# Patient Record
Sex: Female | Born: 1955 | Race: White | Hispanic: No | Marital: Married | Smoking: Current some day smoker
Health system: Southern US, Community
[De-identification: ages and names within clinical notes are randomized; demographics above are authoritative.]

## PROBLEM LIST (undated history)

## (undated) DIAGNOSIS — I10 Essential (primary) hypertension: Secondary | ICD-10-CM

## (undated) HISTORY — PX: FOOT SURGERY: SHX648

## (undated) HISTORY — PX: ABDOMINAL HYSTERECTOMY: SHX81

## (undated) HISTORY — PX: CHOLECYSTECTOMY: SHX55

---

## 1999-01-18 ENCOUNTER — Encounter: Admission: RE | Admit: 1999-01-18 | Discharge: 1999-01-18 | Payer: Self-pay | Admitting: Obstetrics and Gynecology

## 1999-01-18 ENCOUNTER — Encounter: Payer: Self-pay | Admitting: Obstetrics and Gynecology

## 2000-12-28 ENCOUNTER — Other Ambulatory Visit: Admission: RE | Admit: 2000-12-28 | Discharge: 2000-12-28 | Payer: Self-pay | Admitting: Obstetrics and Gynecology

## 2001-12-11 ENCOUNTER — Other Ambulatory Visit: Admission: RE | Admit: 2001-12-11 | Discharge: 2001-12-11 | Payer: Self-pay | Admitting: Obstetrics and Gynecology

## 2003-08-07 ENCOUNTER — Ambulatory Visit (HOSPITAL_COMMUNITY): Admission: RE | Admit: 2003-08-07 | Discharge: 2003-08-07 | Payer: Self-pay | Admitting: Family Medicine

## 2004-03-24 ENCOUNTER — Ambulatory Visit: Payer: Self-pay | Admitting: Family Medicine

## 2004-10-14 ENCOUNTER — Ambulatory Visit: Payer: Self-pay | Admitting: Family Medicine

## 2005-02-14 ENCOUNTER — Ambulatory Visit: Payer: Self-pay | Admitting: Family Medicine

## 2005-06-14 ENCOUNTER — Encounter: Admission: RE | Admit: 2005-06-14 | Discharge: 2005-06-14 | Payer: Self-pay | Admitting: Obstetrics and Gynecology

## 2006-05-08 ENCOUNTER — Ambulatory Visit: Payer: Self-pay | Admitting: Family Medicine

## 2006-10-17 ENCOUNTER — Encounter: Admission: RE | Admit: 2006-10-17 | Discharge: 2006-10-17 | Payer: Self-pay | Admitting: Obstetrics and Gynecology

## 2008-01-08 ENCOUNTER — Encounter: Admission: RE | Admit: 2008-01-08 | Discharge: 2008-01-08 | Payer: Self-pay | Admitting: Obstetrics and Gynecology

## 2010-01-11 ENCOUNTER — Encounter
Admission: RE | Admit: 2010-01-11 | Discharge: 2010-01-11 | Payer: Self-pay | Source: Home / Self Care | Attending: Obstetrics and Gynecology | Admitting: Obstetrics and Gynecology

## 2010-01-24 ENCOUNTER — Encounter: Payer: Self-pay | Admitting: Family Medicine

## 2010-01-24 ENCOUNTER — Encounter: Payer: Self-pay | Admitting: Obstetrics and Gynecology

## 2010-01-24 ENCOUNTER — Encounter: Payer: Self-pay | Admitting: Obstetrics & Gynecology

## 2011-12-23 ENCOUNTER — Other Ambulatory Visit: Payer: Self-pay | Admitting: Obstetrics and Gynecology

## 2011-12-23 ENCOUNTER — Other Ambulatory Visit: Payer: Self-pay | Admitting: Family Medicine

## 2011-12-23 DIAGNOSIS — Z1231 Encounter for screening mammogram for malignant neoplasm of breast: Secondary | ICD-10-CM

## 2012-01-24 ENCOUNTER — Ambulatory Visit
Admission: RE | Admit: 2012-01-24 | Discharge: 2012-01-24 | Disposition: A | Discharge status: Home / Self Care | Payer: BC Managed Care – PPO | Source: Ambulatory Visit | Attending: Family Medicine | Admitting: Family Medicine

## 2012-01-24 DIAGNOSIS — Z1231 Encounter for screening mammogram for malignant neoplasm of breast: Secondary | ICD-10-CM

## 2012-06-16 ENCOUNTER — Emergency Department (HOSPITAL_COMMUNITY)
Admission: EM | Admit: 2012-06-16 | Discharge: 2012-06-16 | Disposition: A | Discharge status: Home / Self Care | Payer: BC Managed Care – PPO | Attending: Emergency Medicine | Admitting: Emergency Medicine

## 2012-06-16 ENCOUNTER — Encounter (HOSPITAL_COMMUNITY): Payer: Self-pay | Admitting: Emergency Medicine

## 2012-06-16 DIAGNOSIS — R630 Anorexia: Secondary | ICD-10-CM | POA: Insufficient documentation

## 2012-06-16 DIAGNOSIS — F17200 Nicotine dependence, unspecified, uncomplicated: Secondary | ICD-10-CM | POA: Insufficient documentation

## 2012-06-16 DIAGNOSIS — M2550 Pain in unspecified joint: Secondary | ICD-10-CM | POA: Insufficient documentation

## 2012-06-16 DIAGNOSIS — R5381 Other malaise: Secondary | ICD-10-CM | POA: Insufficient documentation

## 2012-06-16 DIAGNOSIS — I10 Essential (primary) hypertension: Secondary | ICD-10-CM | POA: Insufficient documentation

## 2012-06-16 DIAGNOSIS — Z79899 Other long term (current) drug therapy: Secondary | ICD-10-CM | POA: Insufficient documentation

## 2012-06-16 DIAGNOSIS — A779 Spotted fever, unspecified: Secondary | ICD-10-CM | POA: Insufficient documentation

## 2012-06-16 DIAGNOSIS — A770 Spotted fever due to Rickettsia rickettsii: Secondary | ICD-10-CM

## 2012-06-16 HISTORY — DX: Essential (primary) hypertension: I10

## 2012-06-16 LAB — POCT I-STAT, CHEM 8
Calcium, Ion: 1.17 mmol/L (ref 1.12–1.23)
Creatinine, Ser: 1.1 mg/dL (ref 0.50–1.10)
Glucose, Bld: 82 mg/dL (ref 70–99)
HCT: 41 % (ref 36.0–46.0)
Hemoglobin: 13.9 g/dL (ref 12.0–15.0)
Potassium: 4 mEq/L (ref 3.5–5.1)
TCO2: 31 mmol/L (ref 0–100)

## 2012-06-16 LAB — CBC WITH DIFFERENTIAL/PLATELET
Basophils Absolute: 0.1 10*3/uL (ref 0.0–0.1)
Basophils Relative: 1 % (ref 0–1)
Eosinophils Relative: 4 % (ref 0–5)
Lymphocytes Relative: 33 % (ref 12–46)
MCHC: 34.9 g/dL (ref 30.0–36.0)
MCV: 86.3 fL (ref 78.0–100.0)
Platelets: 219 10*3/uL (ref 150–400)
RDW: 12.3 % (ref 11.5–15.5)
WBC: 7.8 10*3/uL (ref 4.0–10.5)

## 2012-06-16 LAB — POCT I-STAT TROPONIN I

## 2012-06-16 LAB — URINALYSIS, ROUTINE W REFLEX MICROSCOPIC
Bilirubin Urine: NEGATIVE
Ketones, ur: NEGATIVE mg/dL
Leukocytes, UA: NEGATIVE
Nitrite: NEGATIVE
Protein, ur: NEGATIVE mg/dL
Urobilinogen, UA: 0.2 mg/dL (ref 0.0–1.0)
pH: 5.5 (ref 5.0–8.0)

## 2012-06-16 MED ORDER — OXYCODONE-ACETAMINOPHEN 5-325 MG PO TABS
2.0000 | ORAL_TABLET | Freq: Once | ORAL | Status: AC
  Administered 2012-06-16: 2 via ORAL
  Filled 2012-06-16: qty 2

## 2012-06-16 NOTE — ED Provider Notes (Signed)
History     CSN: [id number]  Arrival date & time 06/16/12  1308   First MD Initiated Contact with Patient 06/16/12 2007      Chief Complaint  Patient presents with  . Generalized Body Aches    joint aches    (Consider location/radiation/quality/duration/timing/severity/associated sxs/prior treatment) HPI This 57 year old female was bit by take a couple weeks ago in both of her left arm, then she felt fine for a week to week later she developed some generalized body aches fatigue general weakness and decreased appetite, she saw her doctor started her on doxycycline which is now and she tested positive for [facility] spotted fever, she was notified by her doctor's office about that lab results, she continues to have some body aches and this was sent to the ED today for evaluation, she is no fever no rash no confusion no headache no stiff neck, a week ago when her symptoms started she did have a headache but that is resolved, she is no confusion, she is no chest pain cough shortness breath abdominal pain vomiting or diarrhea, she just has generalized weakness generalized fatigue and generalized arthralgias and myalgias with no treatment prior to arrival other than doxycycline. Her symptoms are moderate severity. Past Medical History  Diagnosis Date  . Hypertension     Past Surgical History  Procedure Laterality Date  . Abdominal hysterectomy    . Foot surgery    . Cholecystectomy    . Cesarean section      No family history on file.  History  Substance Use Topics  . Smoking status: Current Some Day Smoker  . Smokeless tobacco: Not on file     Comment: uses E-cig at times  . Alcohol Use: Yes     Comment: social    OB History   Grav Para Term Preterm Abortions TAB SAB Ect Mult Living                  Review of Systems 10 Systems reviewed and are negative for acute change except as noted in the HPI. Allergies  Review of patient's allergies indicates no known  allergies.  Home Medications   Current Outpatient Rx  Name  Route  Sig  Dispense  Refill  . cholecalciferol (VITAMIN D) 1000 UNITS tablet   Oral   Take 1,000 Units by mouth daily.         Marland Kitchen doxycycline (VIBRA-TABS) 100 MG tablet   Oral   Take 100 mg by mouth 2 (two) times daily. Started 6.10.14 for 10 days         . estrogen-methylTESTOSTERone (ESTRATEST) 1.25-2.5 MG per tablet   Oral   Take 1 tablet by mouth daily.         Marland Kitchen levothyroxine (SYNTHROID, LEVOTHROID) 50 MCG tablet   Oral   Take 50 mcg by mouth daily before breakfast.         . promethazine (PHENERGAN) 25 MG tablet   Oral   Take 25 mg by mouth every 6 (six) hours as needed for nausea.           BP 137/88  Pulse 61  Temp(Src) 97.9 F (36.6 C) (Oral)  Resp 16  SpO2 98%  Physical Exam  Nursing note and vitals reviewed. Constitutional:  Awake, alert, nontoxic appearance.  HENT:  Head: Atraumatic.  Eyes: Right eye exhibits no discharge. Left eye exhibits no discharge.  Neck: Neck supple.  Cardiovascular: Normal rate and regular rhythm.   No murmur heard.  Pulmonary/Chest: Effort normal and breath sounds normal. No respiratory distress. She has no wheezes. She has no rales. She exhibits no tenderness.  Abdominal: Soft. Bowel sounds are normal. She exhibits no distension. There is no tenderness. There is no rebound and no guarding.  Musculoskeletal: She exhibits no edema and no tenderness.  Baseline ROM, no obvious new focal weakness.  Neurological: She is alert.  Mental status and motor strength appears baseline for patient and situation.  Skin: No rash noted.  Psychiatric: She has a normal mood and affect.    ED Course  Procedures (including critical care time)  Labs Reviewed  URINALYSIS, ROUTINE W REFLEX MICROSCOPIC - Abnormal; Notable for the following:    APPearance CLOUDY (*)    All other components within normal limits  CBC WITH DIFFERENTIAL  POCT I-STAT TROPONIN I  CG4 I-STAT  (LACTIC ACID)  POCT I-STAT, CHEM 8   No results found.   1. [facility] spotted fever       MDM  I doubt any other EMC precluding discharge at this time including, but not necessarily limited to the following:sepsis.        Hurman Horn, MD 06/17/12 [phone]

## 2012-06-16 NOTE — ED Notes (Addendum)
Pt c/o joint aches and not having any energy. Pt was seen at an urgent care earlier this week for headache, reports that she found a tick on her 2 weeks ago. Pt tested for [place] spotted fever and was told yesterday that she has it. Pt also c/o having epigastric pain onset yesterday.

## 2015-01-28 ENCOUNTER — Ambulatory Visit (INDEPENDENT_AMBULATORY_CARE_PROVIDER_SITE_OTHER): Payer: Managed Care, Other (non HMO) | Admitting: Podiatry

## 2015-01-28 ENCOUNTER — Ambulatory Visit: Payer: Self-pay

## 2015-01-28 DIAGNOSIS — M2040 Other hammer toe(s) (acquired), unspecified foot: Secondary | ICD-10-CM

## 2015-01-28 DIAGNOSIS — D169 Benign neoplasm of bone and articular cartilage, unspecified: Secondary | ICD-10-CM | POA: Diagnosis not present

## 2015-01-28 MED ORDER — DIAZEPAM 5 MG PO TABS: 5.0000 mg | ORAL_TABLET | Freq: Two times a day (BID) | ORAL | Status: DC | PRN

## 2015-01-28 NOTE — Patient Instructions (Signed)
Pre-Operative Instructions  Congratulations, you have decided to take an important step to improving your quality of life.  You can be assured that the doctors of [facility] will be with you every step of the way.  1. Plan to be at the surgery center/hospital at least 1 (one) hour prior to your scheduled time unless otherwise directed by the surgical center/hospital staff.  You must have a responsible adult accompany you, remain during the surgery and drive you home.  Make sure you have directions to the surgical center/hospital and know how to get there on time. 2. For hospital based surgery you will need to obtain a history and physical form from your family physician within 1 month prior to the date of surgery- we will give you a form for you primary physician.  3. We make every effort to accommodate the date you request for surgery.  There are however, times where surgery dates or times have to be moved.  We will contact you as soon as possible if a change in schedule is required.   4. No Aspirin/Ibuprofen for one week before surgery.  If you are on aspirin, any non-steroidal anti-inflammatory medications (Mobic, Aleve, Ibuprofen) you should stop taking it 7 days prior to your surgery.  You make take Tylenol  For pain prior to surgery.  5. Medications- If you are taking daily heart and blood pressure medications, seizure, reflux, allergy, asthma, anxiety, pain or diabetes medications, make sure the surgery center/hospital is aware before the day of surgery so they may notify you which medications to take or avoid the day of surgery. 6. No food or drink after midnight the night before surgery unless directed otherwise by surgical center/hospital staff. 7. No alcoholic beverages 24 hours prior to surgery.  No smoking 24 hours prior to or 24 hours after surgery. 8. Wear loose pants or shorts- loose enough to fit over bandages, boots, and casts. 9. No slip on shoes, sneakers are best. 10. Bring  your boot with you to the surgery center/hospital.  Also bring crutches or a walker if your physician has prescribed it for you.  If you do not have this equipment, it will be provided for you after surgery. 11. If you have not been contracted by the surgery center/hospital by the day before your surgery, call to confirm the date and time of your surgery. 12. Leave-time from work may vary depending on the type of surgery you have.  Appropriate arrangements should be made prior to surgery with your employer. 13. Prescriptions will be provided immediately following surgery by your doctor.  Have these filled as soon as possible after surgery and take the medication as directed. 14. Remove nail polish on the operative foot. 15. Wash the night before surgery.  The night before surgery wash the foot and leg well with the antibacterial soap provided and water paying special attention to beneath the toenails and in between the toes.  Rinse thoroughly with water and dry well with a towel.  Perform this wash unless told not to do so by your physician.  Enclosed: 1 Ice pack (please put in freezer the night before surgery)   1 Hibiclens skin cleaner   Pre-op Instructions  If you have any questions regarding the instructions, do not hesitate to call our office.  [place]: [address] [place], [place] [address] [phone]  [place]: [address] [place], [place] [address] [phone]  [place]: [address]  [place], [place] [address] [phone]  Dr. Richard   Tuchman DPM, Dr. Norman Regal DPM Dr. Richard Sikora DPM, Dr. M. Todd Hyatt DPM, Dr. Kathryn Egerton DPM 

## 2015-01-28 NOTE — Progress Notes (Signed)
Subjective:     Patient ID: Sandra Norman, female   DOB: [birth year], 60 y.o.   MRN: [id number]  HPI patient states I'm having terrible pain with my fifth toe and fourth toe left and it's been present for at least a year and I've tried to trim it and pad it and I did do another doctor and had an injection and padding and trimming without relief of my symptoms. I've tried different shoe gear and I've tried different   Review of Systems  All other systems reviewed and are negative.      Objective:   Physical Exam  Constitutional: She is oriented to person, place, and time.  Cardiovascular: Intact distal pulses.   Musculoskeletal: Normal range of motion.  Neurological: She is oriented to person, place, and time.  Skin: Skin is warm.  Nursing note and vitals reviewed.  neurovascular status found to be intact muscle strength adequate range of motion within normal limits with patient found to have a keratotic lesion fifth and fourth toe left foot with the fifth toe being on the distal medial aspect and the fourth toe on the lateral side with both of them being very painful and making it difficult to wear shoe gear. 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 At this point I have recommended due to long-term nature failure to respond to conservative care for distal medial exostectomy fifth digit left and arthroplasty fourth digit left. I explained the surgery to patient and allowed her and her caregiver to go over consent form reviewing exostectomy and hammertoe repair and all complications and the fact we will do this in the office and I reviewed with her again everything as outlined in the consent form. Patient is scheduled for outpatient surgery in is  encouraged to call with questions  X-ray Report indicate significant rotation of the fifth toe left with pressure of fifth toe against fourth toe with enlargement of the head of the proximal phalanx fourth toe left foot

## 2015-01-28 NOTE — Progress Notes (Signed)
   Subjective:    Patient ID: Sandra Norman, female    DOB: 12-08-1955, 60 y.o.   MRN: [id number]  HPI  Pt presents with painful interdigital corn 5th left met x several months, had treatment to scrap area with no relief  Review of Systems  All other systems reviewed and are negative.      Objective:   Physical Exam        Assessment & Plan:

## 2015-02-02 ENCOUNTER — Ambulatory Visit (INDEPENDENT_AMBULATORY_CARE_PROVIDER_SITE_OTHER): Payer: Managed Care, Other (non HMO) | Admitting: Podiatry

## 2015-02-02 ENCOUNTER — Encounter: Payer: Self-pay | Admitting: Podiatry

## 2015-02-02 DIAGNOSIS — M2042 Other hammer toe(s) (acquired), left foot: Secondary | ICD-10-CM

## 2015-02-02 DIAGNOSIS — M2040 Other hammer toe(s) (acquired), unspecified foot: Secondary | ICD-10-CM

## 2015-02-02 DIAGNOSIS — D169 Benign neoplasm of bone and articular cartilage, unspecified: Secondary | ICD-10-CM

## 2015-02-02 MED ORDER — HYDROCODONE-ACETAMINOPHEN 10-325 MG PO TABS: 1.0000 | ORAL_TABLET | Freq: Three times a day (TID) | ORAL | Status: DC | PRN

## 2015-02-02 NOTE — Progress Notes (Signed)
Subjective:     Patient ID: Sandra Norman, female   DOB: 1955-10-31, 60 y.o.   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 The patient's left foot was prepped and draped utilizing standard aseptic technique and the left foot was exsanguinated with Ace wrap and the tourniquet was inflated to 250 mmHg. The following procedures were performed. Attention was directed dorsal aspect fourth toe left where a semi-elliptical incision linear was made over the head of the proximal phalanx and the incision was deepened through subcutaneous tissue down to capsule and the skin wedge was removed in toto. A incision into the extensor tendon was made at the level of the proximal interphalangeal joint and the medial and lateral collateral ligaments were released and the head of the proximal phalanx was delivered from the wound and resected in toto. The wound was flushed with copious amounts of sterile Garamycin solution and the incision was sutured with 5-0 nylon. Procedure 2 attention was directed to the left fifth toe where a incision was made centered over the prominent exostosis on the medial side of the toe. The bone was exposed and utilizing a power sidecutting bur the offending exostosis was resected flush with the shaft of the fifth digit. All bone paste was removed from the area and the area was then flushed again and sutured with 5-0 nylon. The areas then had sterile dressing applied the tourniquet was released and capillary refill to the digits was found to be within normal limits. Patient was  taken to postop with a prescription for hydrocodone and left in satisfactory condition and will be seen back in approximately 1-2 weeks or earlier if needed

## 2015-02-20 ENCOUNTER — Ambulatory Visit (INDEPENDENT_AMBULATORY_CARE_PROVIDER_SITE_OTHER): Payer: Managed Care, Other (non HMO)

## 2015-02-20 ENCOUNTER — Encounter: Payer: Self-pay | Admitting: Podiatry

## 2015-02-20 ENCOUNTER — Ambulatory Visit (INDEPENDENT_AMBULATORY_CARE_PROVIDER_SITE_OTHER): Payer: Managed Care, Other (non HMO) | Admitting: Podiatry

## 2015-02-20 VITALS — Temp 98.1°F

## 2015-02-20 DIAGNOSIS — Z9889 Other specified postprocedural states: Secondary | ICD-10-CM

## 2015-02-20 DIAGNOSIS — M2040 Other hammer toe(s) (acquired), unspecified foot: Secondary | ICD-10-CM | POA: Diagnosis not present

## 2015-02-20 DIAGNOSIS — D169 Benign neoplasm of bone and articular cartilage, unspecified: Secondary | ICD-10-CM

## 2015-02-23 NOTE — Progress Notes (Signed)
Patient ID: Makynleigh Houx, female   DOB: 1955/06/04, 60 y.o.   MRN: [id number]  Subjective: Ledora Selva is a 60 y.o. is seen today in office s/p left 4th  Hammertoe and 5th digit exostectomy preformed on 02/02/15 with Dr. Paulla Dolly. She states that she is doing well and she remained the surgical shoe. Her pain is controlled. She is currently not taking pain medication.  Denies any systemic complaints such as fevers, chills, nausea, vomiting. No calf pain, chest pain, shortness of breath.   Objective: General: No acute distress, AAOx3  DP/PT pulses palpable 2/4, CRT < 3 sec to all digits.  Protective sensation intact. Motor function intact.  Left foot: Incision is well coapted without any evidence of dehiscence and sutures itnact. There is no surrounding erythema, ascending cellulitis, fluctuance, crepitus, malodor, drainage/purulence. There is minimal edema around the surgical site. There is no pain along the surgical site. Toes are in a rectus position No other areas of tenderness to bilateral lower extremities.  No other open lesions or pre-ulcerative lesions.  No pain with calf compression, swelling, warmth, erythema.   Assessment and Plan:  Status post left 4th and 5th toe correction, doing well with no complications   -Treatment options discussed including all alternatives, risks, and complications -X-rays were obtained and reviewed with the patient. No definitive evidence of acute fracture stress fracture. Status post repair fourth and fifth toes. -Sutures removed today. She consented to shower. Continue to the toes to help with swelling. She can start to transition back into regular shoe as tolerated. -Ice/elevation -Pain medication as needed. -Monitor for any clinical signs or symptoms of infection and DVT/PE and directed to call the office immediately should any occur or go to the ER. -Follow-up as scheduled  or sooner if any problems arise. In the meantime, encouraged to call the office  with any questions, concerns, change in symptoms.   Celesta Gentile, DPM

## 2015-03-13 ENCOUNTER — Ambulatory Visit (INDEPENDENT_AMBULATORY_CARE_PROVIDER_SITE_OTHER): Payer: Managed Care, Other (non HMO) | Admitting: Podiatry

## 2015-03-13 ENCOUNTER — Ambulatory Visit (INDEPENDENT_AMBULATORY_CARE_PROVIDER_SITE_OTHER): Payer: Managed Care, Other (non HMO)

## 2015-03-13 ENCOUNTER — Encounter: Payer: Self-pay | Admitting: Podiatry

## 2015-03-13 DIAGNOSIS — D169 Benign neoplasm of bone and articular cartilage, unspecified: Secondary | ICD-10-CM

## 2015-03-13 DIAGNOSIS — Z9889 Other specified postprocedural states: Secondary | ICD-10-CM

## 2015-03-13 DIAGNOSIS — M2040 Other hammer toe(s) (acquired), unspecified foot: Secondary | ICD-10-CM | POA: Diagnosis not present

## 2015-03-13 NOTE — Progress Notes (Signed)
Subjective:     Patient ID: Sandra Norman, female   DOB: [birth year], 60 y.o.   MRN: [id number]  HPI patient states she's doing real well with her left foot with minimal discomfort swelling and able to wear most types shoes except for closed in tight shoes   Review of Systems     Objective:   Physical Exam Neurovascular status intact muscle strength adequate range of motion within normal limits with patient's fourth and fifth digits left in good position with wound edges that are coapted well    Assessment:     Doing well post arthroplasty exostectomy    Plan:     Reviewed and advised on gradual return to normal shoe gear and reviewed final x-ray and discharge at this time  X-ray report indicates good alignment of the fourth and fifth digits was satisfactory resection of bone

## 2015-03-13 NOTE — Progress Notes (Signed)
Patient ID: Sandra Norman, female   DOB: Sep 29, 1955, 60 y.o.   MRN: [id number] Dr Paulla Dolly performed an exostectomy 5th lt and arthroplasty with removal of bone 4th, in office procedure on 02/02/15

## 2015-12-24 ENCOUNTER — Encounter (INDEPENDENT_AMBULATORY_CARE_PROVIDER_SITE_OTHER): Payer: Managed Care, Other (non HMO) | Admitting: Ophthalmology

## 2015-12-24 DIAGNOSIS — I10 Essential (primary) hypertension: Secondary | ICD-10-CM

## 2015-12-24 DIAGNOSIS — H35033 Hypertensive retinopathy, bilateral: Secondary | ICD-10-CM

## 2015-12-24 DIAGNOSIS — H2513 Age-related nuclear cataract, bilateral: Secondary | ICD-10-CM

## 2015-12-24 DIAGNOSIS — H353121 Nonexudative age-related macular degeneration, left eye, early dry stage: Secondary | ICD-10-CM

## 2015-12-24 DIAGNOSIS — H43813 Vitreous degeneration, bilateral: Secondary | ICD-10-CM

## 2015-12-29 ENCOUNTER — Other Ambulatory Visit: Payer: Self-pay | Admitting: Adult Health Nurse Practitioner

## 2015-12-29 DIAGNOSIS — M8580 Other specified disorders of bone density and structure, unspecified site: Secondary | ICD-10-CM

## 2015-12-29 DIAGNOSIS — E8940 Asymptomatic postprocedural ovarian failure: Secondary | ICD-10-CM

## 2015-12-29 DIAGNOSIS — Z1231 Encounter for screening mammogram for malignant neoplasm of breast: Secondary | ICD-10-CM

## 2016-01-18 ENCOUNTER — Other Ambulatory Visit: Payer: Self-pay

## 2016-01-18 ENCOUNTER — Ambulatory Visit: Payer: Self-pay

## 2016-02-17 ENCOUNTER — Other Ambulatory Visit: Payer: Self-pay

## 2016-02-17 ENCOUNTER — Ambulatory Visit: Payer: Self-pay

## 2019-03-28 ENCOUNTER — Other Ambulatory Visit: Payer: Self-pay | Admitting: Family Medicine

## 2019-03-28 DIAGNOSIS — Z1231 Encounter for screening mammogram for malignant neoplasm of breast: Secondary | ICD-10-CM

## 2019-04-05 ENCOUNTER — Other Ambulatory Visit: Payer: Self-pay

## 2019-04-05 ENCOUNTER — Ambulatory Visit
Admission: RE | Admit: 2019-04-05 | Discharge: 2019-04-05 | Disposition: A | Discharge status: Home / Self Care | Payer: BC Managed Care – PPO | Source: Ambulatory Visit | Attending: Family Medicine | Admitting: Family Medicine

## 2019-04-05 DIAGNOSIS — Z1231 Encounter for screening mammogram for malignant neoplasm of breast: Secondary | ICD-10-CM

## 2020-06-02 ENCOUNTER — Other Ambulatory Visit: Payer: Self-pay | Admitting: Family Medicine

## 2020-06-02 DIAGNOSIS — Z1231 Encounter for screening mammogram for malignant neoplasm of breast: Secondary | ICD-10-CM

## 2020-06-05 ENCOUNTER — Other Ambulatory Visit: Payer: Self-pay

## 2020-06-05 ENCOUNTER — Ambulatory Visit
Admission: RE | Admit: 2020-06-05 | Discharge: 2020-06-05 | Disposition: A | Discharge status: Home / Self Care | Payer: Medicare Other | Source: Ambulatory Visit | Attending: Family Medicine | Admitting: Family Medicine

## 2020-06-05 DIAGNOSIS — Z1231 Encounter for screening mammogram for malignant neoplasm of breast: Secondary | ICD-10-CM

## 2021-06-06 ENCOUNTER — Emergency Department (HOSPITAL_BASED_OUTPATIENT_CLINIC_OR_DEPARTMENT_OTHER): Payer: BC Managed Care – PPO

## 2021-06-06 ENCOUNTER — Encounter (HOSPITAL_BASED_OUTPATIENT_CLINIC_OR_DEPARTMENT_OTHER): Payer: Self-pay

## 2021-06-06 ENCOUNTER — Other Ambulatory Visit: Payer: Self-pay

## 2021-06-06 ENCOUNTER — Emergency Department (HOSPITAL_BASED_OUTPATIENT_CLINIC_OR_DEPARTMENT_OTHER)
Admission: EM | Admit: 2021-06-06 | Discharge: 2021-06-06 | Disposition: A | Discharge status: Home / Self Care | Payer: BC Managed Care – PPO | Attending: Emergency Medicine | Admitting: Emergency Medicine

## 2021-06-06 DIAGNOSIS — S0990XA Unspecified injury of head, initial encounter: Secondary | ICD-10-CM | POA: Diagnosis present

## 2021-06-06 DIAGNOSIS — S52501A Unspecified fracture of the lower end of right radius, initial encounter for closed fracture: Secondary | ICD-10-CM | POA: Insufficient documentation

## 2021-06-06 DIAGNOSIS — R42 Dizziness and giddiness: Secondary | ICD-10-CM | POA: Insufficient documentation

## 2021-06-06 DIAGNOSIS — W19XXXA Unspecified fall, initial encounter: Secondary | ICD-10-CM

## 2021-06-06 DIAGNOSIS — R519 Headache, unspecified: Secondary | ICD-10-CM | POA: Diagnosis not present

## 2021-06-06 DIAGNOSIS — Z7982 Long term (current) use of aspirin: Secondary | ICD-10-CM | POA: Insufficient documentation

## 2021-06-06 DIAGNOSIS — W1830XA Fall on same level, unspecified, initial encounter: Secondary | ICD-10-CM | POA: Insufficient documentation

## 2021-06-06 DIAGNOSIS — M25531 Pain in right wrist: Secondary | ICD-10-CM | POA: Diagnosis present

## 2021-06-06 DIAGNOSIS — S65301A Unspecified injury of deep palmar arch of right hand, initial encounter: Secondary | ICD-10-CM | POA: Diagnosis present

## 2021-06-06 NOTE — Discharge Instructions (Signed)
Call tomorrow to make an appointment this week with the radiologist as discussed.  Return to emergency room if you have any worsening symptoms.

## 2021-06-06 NOTE — ED Provider Notes (Signed)
[facility] Provider Note   CSN: [id number] Arrival date & time: 06/06/21  1002     History  Chief Complaint  Patient presents with   Fall   Hand Injury    Sandra Norman is a 66 y.o. female.  Patient is a 66 year old female who presents after a fall.  She states that last night she was putting on some tanning lotion and was bending over and fell backwards.  She lost her balance.  There was no dizziness or other symptoms preceding the fall.  She hit her head on the wall, making a hole in the wall.  There is no loss of conscious but she has been having a little bit of dizziness and a little bit of nausea today.  No neck or back pain.  She has pain in her tailbone and her right hand.  She is not on anticoagulants other than a baby aspirin.      Home Medications Prior to Admission medications   Medication Sig Start Date End Date Taking? Authorizing Provider  cholecalciferol (VITAMIN D) 1000 UNITS tablet Take 1,000 Units by mouth daily.    [provider]  diazepam (VALIUM) 5 MG tablet Take 1 tablet (5 mg total) by mouth every 12 (twelve) hours as needed for anxiety. 01/28/15   Wallene Huh, DPM  doxycycline (VIBRA-TABS) 100 MG tablet Take 100 mg by mouth 2 (two) times daily. Reported on 01/28/2015    [provider]  estrogen-methylTESTOSTERone (ESTRATEST) 1.25-2.5 MG per tablet Take 1 tablet by mouth daily.    [provider]  HYDROcodone-acetaminophen (NORCO) 10-325 MG tablet Take 1 tablet by mouth every 8 (eight) hours as needed. 02/02/15   Wallene Huh, DPM  levothyroxine (SYNTHROID, LEVOTHROID) 50 MCG tablet Take 50 mcg by mouth daily before breakfast.    [provider]  promethazine (PHENERGAN) 25 MG tablet Take 25 mg by mouth every 6 (six) hours as needed for nausea. Reported on 01/28/2015    [provider]      Allergies    Patient has no known allergies.    Review of Systems   Review of Systems   Constitutional:  Negative for activity change, appetite change and fever.  HENT:  Negative for dental problem, nosebleeds and trouble swallowing.   Eyes:  Negative for pain and visual disturbance.  Respiratory:  Negative for shortness of breath.   Cardiovascular:  Negative for chest pain.  Gastrointestinal:  Positive for nausea. Negative for abdominal pain and vomiting.  Genitourinary:  Negative for dysuria and hematuria.  Musculoskeletal:  Positive for arthralgias and back pain. Negative for joint swelling and neck pain.  Skin:  Negative for wound.  Neurological:  Positive for dizziness and headaches. Negative for weakness and numbness.  Psychiatric/Behavioral:  Negative for confusion.    Physical Exam Updated Vital Signs BP 127/66 (BP Location: Right Arm)   Pulse 70   Temp 98 F (36.7 C) (Oral)   Resp 14   SpO2 96%  Physical Exam Constitutional:      Appearance: She is well-developed.  HENT:     Head: Normocephalic and atraumatic.  Eyes:     Pupils: Pupils are equal, round, and reactive to light.  Neck:     Comments: No pain to the cervical thoracic or lumbosacral spine.  There are some mild tenderness over the coccyx Cardiovascular:     Rate and Rhythm: Normal rate and regular rhythm.     Heart sounds: Normal heart sounds.  Pulmonary:     Effort: Pulmonary effort is normal. No respiratory distress.     Breath sounds: Normal breath sounds. No wheezing or rales.  Chest:     Chest wall: No tenderness.  Abdominal:     General: Bowel sounds are normal.     Palpations: Abdomen is soft.     Tenderness: There is no abdominal tenderness. There is no guarding or rebound.  Musculoskeletal:        General: Normal range of motion.     Comments: There is some ecchymosis and tenderness over the dorsum of the right hand.  There are some tenderness over the second third and fourth metacarpals.  There is tenderness over the distal radius on the right.  There is no pain to the elbow or  shoulder.  No other pain on palpation or range of motion of the extremities.  No wounds on the right hand.  She has normal sensation and motor function distally.  Radial pulses are intact.  Lymphadenopathy:     Cervical: No cervical adenopathy.  Skin:    General: Skin is warm and dry.     Findings: No rash.  Neurological:     General: No focal deficit present.     Mental Status: She is alert and oriented to person, place, and time.    ED Results / Procedures / Treatments   Labs (all labs ordered are listed, but only abnormal results are displayed) Labs Reviewed - No data to display  EKG None  Radiology DG Sacrum/Coccyx  Result Date: 06/06/2021 CLINICAL DATA:  Fall last night.  Sacrococcygeal pain. EXAM: SACRUM AND COCCYX - 2+ VIEW COMPARISON:  None Available. FINDINGS: There is no evidence of fracture or other focal bone lesions. IMPRESSION: Negative. Electronically Signed   By: Marlaine Hind M.D.   On: 06/06/2021 11:14   DG Wrist Complete Right  Result Date: 06/06/2021 CLINICAL DATA:  Fall yesterday.  Right wrist pain and swelling. EXAM: RIGHT WRIST - COMPLETE 3+ VIEW COMPARISON:  None Available. FINDINGS: Nondisplaced fracture is seen through the distal radial metaphysis with intra-articular extension. No other fractures identified. No evidence of dislocation. 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 IMPRESSION: Nondisplaced fracture of the distal radius, with intra-articular extension. Electronically Signed   By: Marlaine Hind M.D.   On: 06/06/2021 11:13   CT Head Wo Contrast  Result Date: 06/06/2021 CLINICAL DATA:  Head trauma, minor (Age >= 65y); Neck trauma (Age >= 65y) EXAM: CT HEAD WITHOUT CONTRAST CT CERVICAL SPINE WITHOUT CONTRAST TECHNIQUE: Multidetector CT imaging of the head and cervical spine was performed following the standard protocol without intravenous contrast. Multiplanar CT image reconstructions of the cervical spine were also generated.  RADIATION DOSE REDUCTION: This exam was performed according to the departmental dose-optimization program which includes automated exposure control, adjustment of the mA and/or kV according to patient size and/or use of iterative reconstruction technique. COMPARISON:  CT 08/07/2003 FINDINGS: CT HEAD FINDINGS Brain: No evidence of acute infarction, hemorrhage, hydrocephalus, extra-axial collection or mass lesion/mass effect. Scattered low-density changes within the periventricular and subcortical white matter compatible with chronic microvascular ischemic change. Vascular: No hyperdense vessel or unexpected calcification. Skull: Normal. Negative for fracture or focal lesion. Sinuses/Orbits: No acute finding. Other: Negative for scalp hematoma. CT CERVICAL SPINE FINDINGS Alignment: Facet joints are aligned without dislocation or traumatic listhesis. Dens and lateral masses are aligned. Skull base and vertebrae: No acute fracture. No primary bone lesion or focal pathologic process. Soft tissues and  spinal canal: No prevertebral fluid or swelling. No visible canal hematoma. Disc levels: Relatively mild cervical spondylosis, most predominantly involving the facet joints. Upper chest: Negative. Other: None. IMPRESSION: 1. No acute intracranial abnormality. 2. No acute cervical spine fracture or subluxation. Electronically Signed   By: Davina Poke D.O.   On: 06/06/2021 11:29   CT Cervical Spine Wo Contrast  Result Date: 06/06/2021 CLINICAL DATA:  Head trauma, minor (Age >= 65y); Neck trauma (Age >= 65y) EXAM: CT HEAD WITHOUT CONTRAST CT CERVICAL SPINE WITHOUT CONTRAST TECHNIQUE: Multidetector CT imaging of the head and cervical spine was performed following the standard protocol without intravenous contrast. Multiplanar CT image reconstructions of the cervical spine were also generated. RADIATION DOSE REDUCTION: This exam was performed according to the departmental dose-optimization program which includes automated  exposure control, adjustment of the mA and/or kV according to patient size and/or use of iterative reconstruction technique. COMPARISON:  CT 08/07/2003 FINDINGS: CT HEAD FINDINGS Brain: No evidence of acute infarction, hemorrhage, hydrocephalus, extra-axial collection or mass lesion/mass effect. Scattered low-density changes within the periventricular and subcortical white matter compatible with chronic microvascular ischemic change. Vascular: No hyperdense vessel or unexpected calcification. Skull: Normal. Negative for fracture or focal lesion. Sinuses/Orbits: No acute finding. Other: Negative for scalp hematoma. CT CERVICAL SPINE FINDINGS Alignment: Facet joints are aligned without dislocation or traumatic listhesis. Dens and lateral masses are aligned. Skull base and vertebrae: No acute fracture. No primary bone lesion or focal pathologic process. Soft tissues and spinal canal: No prevertebral fluid or swelling. No visible canal hematoma. Disc levels: Relatively mild cervical spondylosis, most predominantly involving the facet joints. Upper chest: Negative. Other: None. IMPRESSION: 1. No acute intracranial abnormality. 2. No acute cervical spine fracture or subluxation. Electronically Signed   By: Davina Poke D.O.   On: 06/06/2021 11:29   DG Hand Complete Right  Result Date: 06/06/2021 CLINICAL DATA:  Fall yesterday.  Right hand injury and pain. EXAM: RIGHT HAND - COMPLETE 3+ VIEW COMPARISON:  None Available. FINDINGS: There is no evidence of fracture or dislocation. Osteoarthritis is seen involving distal interphalangeal joints, greatest in the index and middle fingers. Osteoarthritis is also seen involving the STT joint complex in the wrist. IMPRESSION: No acute findings. Osteoarthritis. Electronically Signed   By: Marlaine Hind M.D.   On: 06/06/2021 11:11    Procedures Procedures    Medications Ordered in ED Medications - No data to display  ED Course/ Medical Decision Making/ A&P                            Medical Decision Making Amount and/or Complexity of Data Reviewed Radiology: ordered and independent interpretation performed. Decision-making details documented in ED Course.  Risk OTC drugs.   Patient presents after mechanical fall.  She had CT scan of her head and cervical spine which showed no acute abnormalities.  X-rays of her right wrist show a nondisplaced distal radius fracture.  X-rays of her right hand show no acute fractures.  X-rays of her coccyx/sacrum show no acute fractures.  These were interpreted by me and confirmed by the radiologist.  She was placed in a thumb spica splint.  She was given information about hand follow-up.  She was advised to call on Monday for an appointment with the orthopedist this week.  She was advised in symptomatic care.  Return precautions were given.  Final Clinical Impression(s) / ED Diagnoses Final diagnoses:  Fall, initial encounter  Closed  fracture of distal end of right radius, unspecified fracture morphology, initial encounter    Rx / DC Orders ED Discharge Orders     None         Malvin Johns, MD 06/06/21 1218

## 2021-06-06 NOTE — ED Triage Notes (Signed)
Pt presents POV d/t a fall last night while putting on tanning lotion. Pt reports she was bending over and all of a sudden she "fell backwards".  Pt denies getting dizzy but unsure why she fell. Pt reports she hit the back of her head on the wall, put a whole in the wall, pain and swelling to her Right hand ans landed on her tail bone on the linoleum floor. Pt reports feeling a "little nauseous" this am. Takes a baby aspirin 81 mg daily

## 2021-06-06 NOTE — ED Notes (Signed)
Discharge paperwork given and understood. 

## 2021-07-07 ENCOUNTER — Other Ambulatory Visit: Payer: Self-pay | Admitting: Family Medicine

## 2021-07-07 DIAGNOSIS — Z1231 Encounter for screening mammogram for malignant neoplasm of breast: Secondary | ICD-10-CM

## 2021-07-13 ENCOUNTER — Ambulatory Visit
Admission: RE | Admit: 2021-07-13 | Discharge: 2021-07-13 | Disposition: A | Discharge status: Home / Self Care | Payer: Medicare Other | Source: Ambulatory Visit | Attending: Family Medicine | Admitting: Family Medicine

## 2021-07-13 DIAGNOSIS — Z1231 Encounter for screening mammogram for malignant neoplasm of breast: Secondary | ICD-10-CM

## 2021-12-03 ENCOUNTER — Other Ambulatory Visit: Payer: Self-pay

## 2021-12-03 ENCOUNTER — Emergency Department (HOSPITAL_BASED_OUTPATIENT_CLINIC_OR_DEPARTMENT_OTHER): Payer: BC Managed Care – PPO | Admitting: Radiology

## 2021-12-03 ENCOUNTER — Emergency Department (HOSPITAL_BASED_OUTPATIENT_CLINIC_OR_DEPARTMENT_OTHER): Payer: BC Managed Care – PPO

## 2021-12-03 ENCOUNTER — Emergency Department (HOSPITAL_BASED_OUTPATIENT_CLINIC_OR_DEPARTMENT_OTHER)
Admission: EM | Admit: 2021-12-03 | Discharge: 2021-12-03 | Disposition: A | Discharge status: Home / Self Care | Payer: BC Managed Care – PPO | Attending: Emergency Medicine | Admitting: Emergency Medicine

## 2021-12-03 ENCOUNTER — Encounter (HOSPITAL_BASED_OUTPATIENT_CLINIC_OR_DEPARTMENT_OTHER): Payer: Self-pay | Admitting: Emergency Medicine

## 2021-12-03 DIAGNOSIS — M79604 Pain in right leg: Secondary | ICD-10-CM | POA: Diagnosis not present

## 2021-12-03 DIAGNOSIS — M7989 Other specified soft tissue disorders: Secondary | ICD-10-CM | POA: Diagnosis not present

## 2021-12-03 DIAGNOSIS — R079 Chest pain, unspecified: Secondary | ICD-10-CM | POA: Diagnosis present

## 2021-12-03 DIAGNOSIS — M79651 Pain in right thigh: Secondary | ICD-10-CM

## 2021-12-03 LAB — BASIC METABOLIC PANEL
Anion gap: 9 (ref 5–15)
BUN: 14 mg/dL (ref 8–23)
CO2: 25 mmol/L (ref 22–32)
Calcium: 9.1 mg/dL (ref 8.9–10.3)
Chloride: 105 mmol/L (ref 98–111)
Creatinine, Ser: 0.93 mg/dL (ref 0.44–1.00)
GFR, Estimated: >60 mL/min (ref >60)
Glucose, Bld: 90 mg/dL (ref 70–99)
Potassium: 3.6 mmol/L (ref 3.5–5.1)
Sodium: 139 mmol/L (ref 135–145)

## 2021-12-03 LAB — D-DIMER, QUANTITATIVE: D-Dimer, Quant: 0.29 ug/mL-FEU (ref 0.00–0.50)

## 2021-12-03 LAB — CBC
HCT: 37.7 % (ref 36.0–46.0)
Hemoglobin: 12.3 g/dL (ref 12.0–15.0)
MCH: 27.6 pg (ref 26.0–34.0)
MCHC: 32.6 g/dL (ref 30.0–36.0)
MCV: 84.7 fL (ref 80.0–100.0)
Platelets: 278 10*3/uL (ref 150–400)
RBC: 4.45 MIL/uL (ref 3.87–5.11)
RDW: 14.5 % (ref 11.5–15.5)
WBC: 7.2 10*3/uL (ref 4.0–10.5)
nRBC: 0 % (ref 0.0–0.2)

## 2021-12-03 LAB — TROPONIN I (HIGH SENSITIVITY)
Troponin I (High Sensitivity): 6 ng/L (ref <18)
Troponin I (High Sensitivity): 6 ng/L (ref <18)

## 2021-12-03 NOTE — Discharge Instructions (Signed)
If you develop recurrent, continued, or worsening chest pain, shortness of breath, fever, vomiting, abdominal or back pain, or any other new/concerning symptoms then return to the ER for evaluation.  

## 2021-12-03 NOTE — ED Triage Notes (Signed)
Pt endorses CP under her breast for 2 weeks. Monday, she had a sharp pain radiate down her left arm. Today, she began having neck numbness.

## 2021-12-03 NOTE — ED Provider Notes (Signed)
[facility] DEPT Provider Note   CSN: [id number] Arrival date & time: 12/03/21  1235     History  Chief Complaint  Patient presents with   Chest Pain    Sandra Norman is a 66 y.o. female.  HPI 66 year old female presents with chest pain and right leg pain.  The chest pains been on and off for about 2 weeks.  Is a sharp pain under her breast.  Seems to come and go without any obvious cause.  It is sometimes pleuritic. The last time she felt it was on her way to chest x-ray today.  For about a week she is also had right posterior leg pain.  She went to urgent care yesterday and she was told to come to the ER last night for concern for DVT.  She has not noticed any color change.  No dyspnea.  Yesterday she noticed that there was pain radiating down her left arm.  Today she realized that her posterior neck feels weird to her and she describes as numb though she has intact sensation when she touches it.  Home Medications Prior to Admission medications   Medication Sig Start Date End Date Taking? Authorizing Provider  amLODipine-valsartan (EXFORGE) 10-160 MG tablet Take 1 tablet by mouth daily. 09/27/21   [provider]  cholecalciferol (VITAMIN D) 1000 UNITS tablet Take 1,000 Units by mouth daily.    [provider]  citalopram (CELEXA) 20 MG tablet Take 20 mg by mouth daily. 10/09/21   [provider]  diazepam (VALIUM) 5 MG tablet Take 1 tablet (5 mg total) by mouth every 12 (twelve) hours as needed for anxiety. 01/28/15   Wallene Huh, DPM  diclofenac Sodium (VOLTAREN) 1 % GEL Apply 1 Application topically 4 (four) times daily. 11/28/21   [provider]  doxycycline (VIBRA-TABS) 100 MG tablet Take 100 mg by mouth 2 (two) times daily. Reported on 01/28/2015    [provider]  estrogen-methylTESTOSTERone (ESTRATEST) 1.25-2.5 MG per tablet Take 1 tablet by mouth daily.    [provider]   HYDROcodone-acetaminophen (NORCO) 10-325 MG tablet Take 1 tablet by mouth every 8 (eight) hours as needed. 02/02/15   Wallene Huh, DPM  levothyroxine (SYNTHROID, LEVOTHROID) 50 MCG tablet Take 50 mcg by mouth daily before breakfast.    [provider]  promethazine (PHENERGAN) 25 MG tablet Take 25 mg by mouth every 6 (six) hours as needed for nausea. Reported on 01/28/2015    [provider]      Allergies    Patient has no known allergies.    Review of Systems   Review of Systems  Respiratory:  Negative for shortness of breath.   Cardiovascular:  Positive for chest pain and leg swelling.  Gastrointestinal:  Negative for abdominal pain.  Musculoskeletal:  Positive for myalgias and neck pain.  Neurological:  Negative for weakness and numbness.    Physical Exam Updated Vital Signs BP (!) 144/96   Pulse (!) 57   Temp 98.4 F (36.9 C) (Oral)   Resp (!) 21   Ht '5\' 3"'$  (1.6 m)   Wt 72.6 kg   SpO2 95%   BMI 28.34 kg/m  Physical Exam Vitals and nursing note reviewed.  Constitutional:      Appearance: She is well-developed.  HENT:     Head: Normocephalic and atraumatic.  Neck:     Comments: Normal sensation to her posterior neck. No tenderness Cardiovascular:     Rate and Rhythm: Normal  rate and regular rhythm.     Pulses:          Radial pulses are 2+ on the left side.       Posterior tibial pulses are 2+ on the right side.     Heart sounds: Normal heart sounds.  Pulmonary:     Effort: Pulmonary effort is normal.     Breath sounds: Normal breath sounds.  Abdominal:     Palpations: Abdomen is soft.     Tenderness: There is no abdominal tenderness.  Musculoskeletal:     Cervical back: No spinous process tenderness or muscular tenderness. Normal range of motion.     Right upper leg: Tenderness (mild) present. No swelling or edema.       Legs:  Skin:    General: Skin is warm and dry.  Neurological:     Mental Status: She is alert.     Comments: 5/5  strength in BUE. Grossly normal sensation     ED Results / Procedures / Treatments   Labs (all labs ordered are listed, but only abnormal results are displayed) Labs Reviewed  BASIC METABOLIC PANEL  CBC  D-DIMER, QUANTITATIVE  TROPONIN I (HIGH SENSITIVITY)  TROPONIN I (HIGH SENSITIVITY)    EKG EKG Interpretation  Date/Time:  Friday December 03 2021 13:46:01 EST Ventricular Rate:  56 PR Interval:  159 QRS Duration: 110 QT Interval:  489 QTC Calculation: 472 R Axis:   57 Text Interpretation: Sinus rhythm no acute ST/T changes similar to 2014 Confirmed by Sherwood Gambler [phone]) on 12/03/2021 2:04:23 PM  Radiology US Venous Img Lower Unilateral Right  Result Date: 12/03/2021 CLINICAL DATA:  Pain and swelling EXAM: Right LOWER EXTREMITY VENOUS DOPPLER ULTRASOUND TECHNIQUE: Gray-scale sonography with compression, as well as color and duplex ultrasound, were performed to evaluate the deep venous system(s) from the level of the common femoral vein through the popliteal and proximal calf veins. COMPARISON:  None Available. FINDINGS: VENOUS Normal compressibility of the common femoral, superficial femoral, and popliteal veins, as well as the visualized calf veins. Visualized portions of profunda femoral vein and great saphenous vein unremarkable. No filling defects to suggest DVT on grayscale or color Doppler imaging. Doppler waveforms show normal direction of venous flow, normal respiratory plasticity and response to augmentation. Limited views of the contralateral common femoral vein are unremarkable. OTHER None. Limitations: Body habitus IMPRESSION: There are no signs of acute DVT in right lower extremity. Electronically Signed   By: Elmer Picker M.D.   On: 12/03/2021 14:13   DG Chest 2 View  Result Date: 12/03/2021 CLINICAL DATA:  Chest pain and shortness of breath EXAM: CHEST - 2 VIEW COMPARISON:  None Available. FINDINGS: The heart size and mediastinal contours are within normal  limits. Mild left basilar atelectasis. Both lungs are otherwise clear. The visualized skeletal structures are unremarkable. IMPRESSION: No active cardiopulmonary disease. Electronically Signed   By: Yetta Glassman M.D.   On: 12/03/2021 13:05    Procedures Procedures    Medications Ordered in ED Medications - No data to display  ED Course/ Medical Decision Making/ A&P                           Medical Decision Making Amount and/or Complexity of Data Reviewed Labs: ordered.    Details: Troponin is normal.  D-dimer is normal.  No significant electrolyte disturbance. Radiology: ordered and independent interpretation performed.    Details: No focal pneumonia or CHF on x-ray.  DVT study without occlusion.   Patient presents with vague on and off chest pain.  Is been going on for weeks.  Troponins are negative.  ECG is not concerning for ischemia.  She also has this thigh pain without obvious trauma.  However there is no blood clot on ultrasound as she has a strong pulse in her foot.  No obvious cellulitis on exam.  She does not have a cough or other infectious symptoms.  She does have a little atelectasis on her chest x-ray but I do not think this represents infarction or pneumonia.  D-dimer is negative with otherwise low suspicion for PE.  At this point, will treat conservatively with NSAIDs, Tylenol, and follow-up with PCP.  Given return precautions.        Final Clinical Impression(s) / ED Diagnoses Final diagnoses:  Nonspecific chest pain  Right thigh pain    Rx / DC Orders ED Discharge Orders     None         Sherwood Gambler, MD 12/03/21 1523

## 2022-01-24 ENCOUNTER — Other Ambulatory Visit: Payer: Self-pay

## 2022-01-24 ENCOUNTER — Observation Stay (HOSPITAL_COMMUNITY)
Admission: EM | Admit: 2022-01-24 | Discharge: 2022-01-25 | Disposition: A | Discharge status: Home / Self Care | Payer: BC Managed Care – PPO | Attending: Cardiology | Admitting: Cardiology

## 2022-01-24 ENCOUNTER — Encounter (HOSPITAL_COMMUNITY): Payer: Self-pay

## 2022-01-24 ENCOUNTER — Emergency Department (HOSPITAL_COMMUNITY): Payer: BC Managed Care – PPO

## 2022-01-24 DIAGNOSIS — Z79899 Other long term (current) drug therapy: Secondary | ICD-10-CM | POA: Insufficient documentation

## 2022-01-24 DIAGNOSIS — E039 Hypothyroidism, unspecified: Secondary | ICD-10-CM | POA: Insufficient documentation

## 2022-01-24 DIAGNOSIS — F17200 Nicotine dependence, unspecified, uncomplicated: Secondary | ICD-10-CM | POA: Insufficient documentation

## 2022-01-24 DIAGNOSIS — I10 Essential (primary) hypertension: Secondary | ICD-10-CM

## 2022-01-24 DIAGNOSIS — R072 Precordial pain: Secondary | ICD-10-CM | POA: Diagnosis present

## 2022-01-24 DIAGNOSIS — R079 Chest pain, unspecified: Secondary | ICD-10-CM

## 2022-01-24 DIAGNOSIS — R0789 Other chest pain: Principal | ICD-10-CM | POA: Insufficient documentation

## 2022-01-24 DIAGNOSIS — I200 Unstable angina: Secondary | ICD-10-CM | POA: Diagnosis not present

## 2022-01-24 DIAGNOSIS — K219 Gastro-esophageal reflux disease without esophagitis: Secondary | ICD-10-CM

## 2022-01-24 DIAGNOSIS — Z7982 Long term (current) use of aspirin: Secondary | ICD-10-CM | POA: Insufficient documentation

## 2022-01-24 LAB — COMPREHENSIVE METABOLIC PANEL
ALT: 22 U/L (ref 0–44)
AST: 26 U/L (ref 15–41)
Albumin: 4.1 g/dL (ref 3.5–5.0)
Alkaline Phosphatase: 64 U/L (ref 38–126)
Anion gap: 10 (ref 5–15)
BUN: 15 mg/dL (ref 8–23)
CO2: 23 mmol/L (ref 22–32)
Calcium: 9.3 mg/dL (ref 8.9–10.3)
Chloride: 106 mmol/L (ref 98–111)
Creatinine, Ser: 1.06 mg/dL — ABNORMAL HIGH (ref 0.44–1.00)
GFR, Estimated: 58 mL/min — ABNORMAL LOW (ref >60)
Glucose, Bld: 103 mg/dL — ABNORMAL HIGH (ref 70–99)
Potassium: 3.7 mmol/L (ref 3.5–5.1)
Sodium: 139 mmol/L (ref 135–145)
Total Bilirubin: 0.4 mg/dL (ref 0.3–1.2)
Total Protein: 7.3 g/dL (ref 6.5–8.1)

## 2022-01-24 LAB — LIPID PANEL
Cholesterol: 178 mg/dL (ref 0–200)
HDL: 40 mg/dL — ABNORMAL LOW (ref >40)
LDL Cholesterol: 106 mg/dL — ABNORMAL HIGH (ref 0–99)
Total CHOL/HDL Ratio: 4.5 RATIO
Triglycerides: 161 mg/dL — ABNORMAL HIGH (ref <150)
VLDL: 32 mg/dL (ref 0–40)

## 2022-01-24 LAB — CBC
HCT: 41.2 % (ref 36.0–46.0)
Hemoglobin: 13.5 g/dL (ref 12.0–15.0)
MCH: 27.9 pg (ref 26.0–34.0)
MCHC: 32.8 g/dL (ref 30.0–36.0)
MCV: 85.1 fL (ref 80.0–100.0)
Platelets: 285 10*3/uL (ref 150–400)
RBC: 4.84 MIL/uL (ref 3.87–5.11)
RDW: 14 % (ref 11.5–15.5)
WBC: 11 10*3/uL — ABNORMAL HIGH (ref 4.0–10.5)
nRBC: 0 % (ref 0.0–0.2)

## 2022-01-24 LAB — HIV ANTIBODY (ROUTINE TESTING W REFLEX): HIV Screen 4th Generation wRfx: NONREACTIVE

## 2022-01-24 LAB — TROPONIN I (HIGH SENSITIVITY)
Troponin I (High Sensitivity): 6 ng/L (ref <18)
Troponin I (High Sensitivity): 5 ng/L (ref <18)
Troponin I (High Sensitivity): 6 ng/L (ref <18)

## 2022-01-24 MED ORDER — ASPIRIN 81 MG PO TBEC: 81.0000 mg | DELAYED_RELEASE_TABLET | Freq: Every day | ORAL | Status: DC

## 2022-01-24 MED ORDER — AMLODIPINE BESYLATE-VALSARTAN 10-160 MG PO TABS: 1.0000 | ORAL_TABLET | Freq: Every day | ORAL | Status: DC

## 2022-01-24 MED ORDER — ONDANSETRON HCL 4 MG/2ML IJ SOLN
4.0000 mg | Freq: Once | INTRAMUSCULAR | Status: DC
  Filled 2022-01-24: qty 2

## 2022-01-24 MED ORDER — HEPARIN (PORCINE) 25000 UT/250ML-% IV SOLN: 800.0000 [IU]/h | INTRAVENOUS | Status: DC

## 2022-01-24 MED ORDER — IRBESARTAN 300 MG PO TABS: 150.0000 mg | ORAL_TABLET | Freq: Every day | ORAL | Status: DC

## 2022-01-24 MED ORDER — ACETAMINOPHEN 325 MG PO TABS: 650.0000 mg | ORAL_TABLET | ORAL | Status: DC | PRN

## 2022-01-24 MED ORDER — AMLODIPINE BESYLATE 5 MG PO TABS: 10.0000 mg | ORAL_TABLET | Freq: Every day | ORAL | Status: DC

## 2022-01-24 MED ORDER — MORPHINE SULFATE (PF) 4 MG/ML IV SOLN
4.0000 mg | Freq: Once | INTRAVENOUS | Status: DC
  Filled 2022-01-24: qty 1

## 2022-01-24 MED ORDER — HEPARIN BOLUS VIA INFUSION
4000.0000 [IU] | Freq: Once | INTRAVENOUS | Status: DC
  Filled 2022-01-24: qty 4000

## 2022-01-24 MED ORDER — SODIUM CHLORIDE 0.9 % IV BOLUS (SEPSIS)
1000.0000 mL | Freq: Once | INTRAVENOUS | Status: AC
  Administered 2022-01-24: 1000 mL via INTRAVENOUS

## 2022-01-24 MED ORDER — LEVOTHYROXINE SODIUM 50 MCG PO TABS
50.0000 ug | ORAL_TABLET | Freq: Every day | ORAL | Status: DC
  Administered 2022-01-25: 50 ug via ORAL
  Filled 2022-01-24: qty 1

## 2022-01-24 MED ORDER — NITROGLYCERIN 0.4 MG SL SUBL
0.4000 mg | SUBLINGUAL_TABLET | SUBLINGUAL | Status: DC | PRN
  Administered 2022-01-24: 0.4 mg via SUBLINGUAL
  Filled 2022-01-24: qty 1

## 2022-01-24 MED ORDER — CITALOPRAM HYDROBROMIDE 10 MG PO TABS: 20.0000 mg | ORAL_TABLET | Freq: Every day | ORAL | Status: DC

## 2022-01-24 MED ORDER — ASPIRIN 81 MG PO CHEW
324.0000 mg | CHEWABLE_TABLET | Freq: Once | ORAL | Status: AC
  Administered 2022-01-24: 243 mg via ORAL
  Filled 2022-01-24: qty 4

## 2022-01-24 MED ORDER — ONDANSETRON HCL 4 MG/2ML IJ SOLN: 4.0000 mg | Freq: Four times a day (QID) | INTRAMUSCULAR | Status: DC | PRN

## 2022-01-24 NOTE — ED Notes (Signed)
Pt ambulated to restroom with EDT. NAD noted.

## 2022-01-24 NOTE — ED Provider Notes (Addendum)
[facility] Provider Note   CSN: [id number] Arrival date & time: 01/24/22  1027     History  Chief Complaint  Patient presents with   Chest Pain    Sandra Norman is a 67 y.o. female.  67 year old female with a history of HTN, tobacco use, and hypothyroidism who presents emergency department with palpitations, dizziness, diaphoresis, and chest discomfort.  Reports that she was at work earlier today and had a 15-minute episode of palpitations and dizziness.  This was accompanied by diaphoresis and she felt very warm went to the restroom and had an episode of nonbloody nonbilious emesis.  Also reports some shortness of breath with it.  Says that since then she has had persistent chest tightness that is substernal.  Unsure if exertional.  No recent illnesses or cough.  No history of MI.  Did have a heart catheterization over 10 years ago that was reportedly normal.  Is going to follow-up with [facility] cardiology tomorrow about an appointment.  Was counseled to decrease her caffeine intake with her palpitations and says that she reports 1 cup of coffee a day.  Denies any significant alcohol use.  Did have a similar episode to this 1 month ago.  No immediate family history of MI.       Home Medications Prior to Admission medications   Medication Sig Start Date End Date Taking? Authorizing Provider  albuterol (VENTOLIN HFA) 108 (90 Base) MCG/ACT inhaler Inhale 1-2 puffs into the lungs every 6 (six) hours as needed for wheezing or shortness of breath.   Yes [provider]  amLODipine-valsartan (EXFORGE) 10-160 MG tablet Take 1 tablet by mouth daily. 09/27/21  Yes [provider]  aspirin EC 81 MG tablet Take 81 mg by mouth daily.   Yes [provider]  CINNAMON PO Take 1 tablet by mouth daily.   Yes [provider]  citalopram (CELEXA) 20 MG tablet Take 20 mg by mouth daily. 10/09/21  Yes [provider]   levothyroxine (SYNTHROID, LEVOTHROID) 50 MCG tablet Take 50 mcg by mouth daily before breakfast.   Yes [provider]  Omega-3 Fatty Acids (FISH OIL PO) Take 1 capsule by mouth daily.   Yes [provider]      Allergies    Patient has no known allergies.    Review of Systems   Review of Systems  Physical Exam Updated Vital Signs BP (!) 142/86   Pulse 91   Temp 98 F (36.7 C) (Oral)   Resp 18   Ht '5\' 3"'$  (1.6 m)   Wt 72.6 kg   SpO2 96%   BMI 28.34 kg/m   Physical Exam Vitals and nursing note reviewed.  Constitutional:      General: She is not in acute distress.    Appearance: She is well-developed.  HENT:     Head: Normocephalic and atraumatic.     Right Ear: External ear normal.     Left Ear: External ear normal.     Nose: Nose normal.  Eyes:     Extraocular Movements: Extraocular movements intact.     Conjunctiva/sclera: Conjunctivae normal.     Pupils: Pupils are equal, round, and reactive to light.  Cardiovascular:     Rate and Rhythm: Normal rate and regular rhythm.     Heart sounds: No murmur heard. Pulmonary:     Effort: Pulmonary effort is normal. No respiratory distress.     Breath sounds: Normal breath sounds.  Abdominal:     General: Abdomen is flat. There is no distension.     Palpations: Abdomen is soft. There is no mass.     Tenderness: There is no abdominal tenderness. There is no guarding.  Musculoskeletal:     Cervical back: Normal range of motion and neck supple.     Right lower leg: No edema.     Left lower leg: No edema.  Skin:    General: Skin is warm and dry.  Neurological:     Mental Status: She is alert and oriented to person, place, and time. Mental status is at baseline.  Psychiatric:        Mood and Affect: Mood normal.     ED Results / Procedures / Treatments   Labs (all labs ordered are listed, but only abnormal results are displayed) Labs Reviewed  CBC - Abnormal; Notable for the following components:       Result Value   WBC 11.0 (*)    All other components within normal limits  COMPREHENSIVE METABOLIC PANEL - Abnormal; Notable for the following components:   Glucose, Bld 103 (*)    Creatinine, Ser 1.06 (*)    GFR, Estimated 58 (*)    All other components within normal limits  CBC  HIV ANTIBODY (ROUTINE TESTING W REFLEX)  LIPID PANEL  TROPONIN I (HIGH SENSITIVITY)  TROPONIN I (HIGH SENSITIVITY)  TROPONIN I (HIGH SENSITIVITY)    EKG EKG Interpretation  Date/Time:  Monday January 24 2022 11:19:52 EST Ventricular Rate:  52 PR Interval:  159 QRS Duration: 118 QT Interval:  518 QTC Calculation: 482 R Axis:   59 Text Interpretation: Sinus rhythm Probable left atrial enlargement Nonspecific intraventricular conduction delay Minimal ST elevation, inferior leads When compared to 12/03/21 no significant change Confirmed by Margaretmary Eddy [phone]) on 01/24/2022 11:38:29 AM  Radiology DG Chest 2 View  Result Date: 01/24/2022 CLINICAL DATA:  Shortness of breath.  Chest tightness. EXAM: CHEST - 2 VIEW COMPARISON:  12/03/2021 FINDINGS: Artifact overlies the chest. Heart size is normal. Mediastinal shadows are normal. The lungs are clear. No effusions. No significant bone finding. IMPRESSION: No active cardiopulmonary disease. Electronically Signed   By: Grabill Chimes M.D.   On: 01/24/2022 11:51    Procedures Procedures   Medications Ordered in ED Medications  nitroGLYCERIN (NITROSTAT) SL tablet 0.4 mg (0.4 mg Sublingual Given 01/24/22 1516)  morphine (PF) 4 MG/ML injection 4 mg (0 mg Intravenous Hold 01/24/22 1655)  ondansetron (ZOFRAN) injection 4 mg (0 mg Intravenous Hold 01/24/22 1655)  acetaminophen (TYLENOL) tablet 650 mg (has no administration in time range)  ondansetron (ZOFRAN) injection 4 mg (has no administration in time range)  aspirin EC tablet 81 mg (has no administration in time range)  citalopram (CELEXA) tablet 20 mg (has no administration in time range)  levothyroxine  (SYNTHROID) tablet 50 mcg (has no administration in time range)  irbesartan (AVAPRO) tablet 150 mg (has no administration in time range)  amLODipine (NORVASC) tablet 10 mg (has no administration in time range)  aspirin chewable tablet 324 mg (243 mg Oral Given 01/24/22 1321)  sodium chloride 0.9 % bolus 1,000 mL (0 mLs Intravenous Stopped 01/24/22 1612)    ED Course/ Medical Decision Making/ A&P Clinical Course as of 01/24/22 1949  Mon Jan 24, 2022  1223 HEART Score of 7 points.  [RP]  1309 Dr Einar Gip has reviewed case. Recommends nitro and GI cocktail if that does not work will consider admission.  [RP]  1528 Pt  became hypotensive with nitroglycerin.  [RP]  1658 Dr Einar Gip to admit [RP]    Clinical Course User Index [RP] Fransico Meadow, MD                             Medical Decision Making Amount and/or Complexity of Data Reviewed Labs: ordered.  Risk OTC drugs. Prescription drug management. Decision regarding hospitalization.   Yareni Creps is a 67 y.o. female with comorbidities that complicate the patient evaluation including HTN, tobacco use, and hypothyroidism who presents emergency department with palpitations, dizziness, diaphoresis, and chest discomfort.   Initial Ddx:  MI, PE, pneumonia, vasovagal syncope, paroxysmal arrhythmia  MDM:  Patient was concerned about possible arrhythmia that was causing the patient's symptoms since she was describing palpitations along with her other symptoms.  MI also under consideration with her chest discomfort and diaphoresis.  Did consider PE but her description of her pain is not consistent with this diagnosis so feel it is less likely.  If above workup is negative potentially could have had a vasovagal episode.  Plan:  Labs Troponin Chest x-ray EKG Aspirin Nitroglycerin  ED Summary/Re-evaluation:  Patient was given nitroglycerin and aspirin with improvement of her chest discomfort.  Did become hypotensive so given morphine  afterwards for pain.  Initial troponin was WNL and EKG without any new ischemic changes.  Discussed with cardiology who felt the patient should be admitted for further management.  They also requested that she be started on heparin drip for possible unstable angina.  This patient presents to the ED for concern of complaints listed in HPI, this involves an extensive number of treatment options, and is a complaint that carries with it a high risk of complications and morbidity. Disposition including potential need for admission considered.   Dispo: Admit to Floor  Additional history obtained from son Records reviewed Outpatient Clinic Notes The following labs were independently interpreted: Serial Troponins and show no acute abnormality I independently reviewed the following imaging with scope of interpretation limited to determining acute life threatening conditions related to emergency care: Chest x-ray and agree with the radiologist interpretation with the following exceptions: None I personally reviewed and interpreted cardiac monitoring: normal sinus rhythm  I personally reviewed and interpreted the pt's EKG: see above for interpretation  I have reviewed the patients home medications and made adjustments as needed Consults: Cardiology Social Determinants of health:  None  Final Clinical Impression(s) / ED Diagnoses Final diagnoses:  Chest pain, unspecified type  Unstable angina ([place])    Rx / DC Orders ED Discharge Orders     None      CRITICAL CARE Performed by: Fransico Meadow   Total critical care time: 30 minutes  Critical care time was exclusive of separately billable procedures and treating other patients.  Critical care was necessary to treat or prevent imminent or life-threatening deterioration.  Critical care was time spent personally by me on the following activities: development of treatment plan with patient and/or surrogate as well as nursing, discussions  with consultants, evaluation of patient's response to treatment, examination of patient, obtaining history from patient or surrogate, ordering and performing treatments and interventions, ordering and review of laboratory studies, ordering and review of radiographic studies, pulse oximetry and re-evaluation of patient's condition.    Fransico Meadow, MD 01/24/22 1949    Fransico Meadow, MD 01/24/22 [phone]

## 2022-01-24 NOTE — ED Triage Notes (Signed)
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  Reports now chest is tight and having tingling in bilateral hands. Was seen in dec for similar symptoms.

## 2022-01-24 NOTE — H&P (Signed)
CARDIOLOGY ADMIT NOTE   Patient ID: Sandra Norman MRN: [id number] DOB/AGE: 08/29/1955 67 y.o.  Admit date: 01/24/2022 Primary Physician:  [name], MD  Patient ID: Sandra Norman, female    DOB: 1955/06/25, 67 y.o.   MRN: [id number]  Chief Complaint  Patient presents with   Chest Pain   HPI:    Sandra Norman  is a 67 y.o. Caucasian female patient with hypertension, nondiabetic, no history of hypercholesterolemia, no family history of premature coronary disease, a month ago had felt chest pain in the middle of the chest while doing routine activity, lasting several days and was actually referred to our practice to be seen tomorrow.  While working, she suddenly felt sick and thought she was going to throw up and also had severe chest pain again in the middle of the chest.  Then she started feeling sick to her stomach, went to the bathroom and had a regular bowel movement and when she came back and emesis followed by near syncope.  In view of chest pain, EMS was activated and patient was eventually brought to the emergency room.  On further questioning, patient started having chest pain at 8 AM this morning and has been continuous.  Worse when she takes a deep breath.  She also has been having symptoms of GERD and heartburn, sometimes feels like the food gets stuck in the pharynx and wonders if cardiac workup is negative she should have GI workup.  While in the emergency room due to chest pain that was ongoing, received sublingual nitroglycerin and patient stated she had significant relief of chest pain.  She then had hypotension and need to be fluid resuscitated and hence I was called for admission and or possible evaluation.  Patient states that chest pain has returned and it is present and describes it as pressure in the middle of the chest.  No other symptoms.  On further questioning, main reason the EMS brought her to the hospital was near syncope and she started complaining of left  arm tingling and numbness.  The symptoms resolved completely within a few minutes.  So presently except for continued mild chest pain she has no other symptoms.  Past Medical History:  Diagnosis Date   Hypertension    Past Surgical History:  Procedure Laterality Date   ABDOMINAL HYSTERECTOMY     CESAREAN SECTION     CHOLECYSTECTOMY     FOOT SURGERY     Social History   Socioeconomic History   Marital status: Married    Spouse name: Not on file   Number of children: Not on file   Years of education: Not on file   Highest education level: Not on file  Occupational History   Not on file  Tobacco Use   Smoking status: Some Days   Smokeless tobacco: Not on file   Tobacco comments:    uses E-cig at times  Substance and Sexual Activity   Alcohol use: Yes    Comment: social   Drug use: No   Sexual activity: Not on file  Other Topics Concern   Not on file  Social History Narrative   Not on file   Social Determinants of Health   Financial Resource Strain: Not on file  Food Insecurity: Not on file  Transportation Needs: Not on file  Physical Activity: Not on file  Stress: Not on file  Social Connections: Not on file  Intimate Partner Violence: Not on file   History reviewed. No pertinent family  history.  ROS  Review of Systems  Cardiovascular:  Positive for chest pain. Negative for dyspnea on exertion and leg swelling.   Objective      01/24/2022    4:15 PM 01/24/2022    4:00 PM 01/24/2022    3:43 PM  Vitals with BMI  Systolic 102 585 277  Diastolic 76 87 87  Pulse 55 52 51      Physical Exam Neck:     Vascular: No carotid bruit or JVD.  Cardiovascular:     Rate and Rhythm: Normal rate and regular rhythm.     Pulses: Intact distal pulses.     Heart sounds: Normal heart sounds. No murmur heard.    No gallop.  Pulmonary:     Effort: Pulmonary effort is normal.     Breath sounds: Normal breath sounds.  Chest:     Chest wall: Tenderness (Precordium, right  and left parasternal regions) present.  Abdominal:     General: Bowel sounds are normal.     Palpations: Abdomen is soft.  Musculoskeletal:     Right lower leg: No edema.     Left lower leg: No edema.    Laboratory examination:   Recent Labs    12/03/21 1250 01/24/22 1050  NA 139 139  K 3.6 3.7  CL 105 106  CO2 25 23  GLUCOSE 90 103*  BUN 14 15  CREATININE 0.93 1.06*  CALCIUM 9.1 9.3  GFRNONAA >60 58*      Latest Ref Rng & Units 01/24/2022   10:50 AM 12/03/2021   12:50 PM 06/16/2012    8:39 PM  CMP  Glucose 70 - 99 mg/dL 103  90  82   BUN 8 - 23 mg/dL '15  14  10   '$ Creatinine 0.44 - 1.00 mg/dL 1.06  0.93  1.10   Sodium 135 - 145 mmol/L 139  139  145   Potassium 3.5 - 5.1 mmol/L 3.7  3.6  4.0   Chloride 98 - 111 mmol/L 106  105  108   CO2 22 - 32 mmol/L 23  25    Calcium 8.9 - 10.3 mg/dL 9.3  9.1    Total Protein 6.5 - 8.1 g/dL 7.3     Total Bilirubin 0.3 - 1.2 mg/dL 0.4     Alkaline Phos 38 - 126 U/L 64     AST 15 - 41 U/L 26     ALT 0 - 44 U/L 22         Latest Ref Rng & Units 01/24/2022   10:50 AM 12/03/2021   12:50 PM 06/16/2012    8:39 PM  CBC  WBC 4.0 - 10.5 K/uL 11.0  7.2    Hemoglobin 12.0 - 15.0 g/dL 13.5  12.3  13.9   Hematocrit 36.0 - 46.0 % 41.2  37.7  41.0   Platelets 150 - 400 K/uL 285  278     Lipid Panel  No results found for: "CHOL", "TRIG", "HDL", "CHOLHDL", "VLDL", "LDLCALC", "LDLDIRECT" HEMOGLOBIN A1C No results found for: "HGBA1C", "MPG" TSH No results for input(s): "TSH" in the last 8760 hours. BNP (last 3 results) No results for input(s): "BNP" in the last 8760 hours. Cardiac Panel (last 3 results) Recent Labs    01/24/22 1050 01/24/22 1236  TROPONINIHS 6 5     Medications and allergies  No Known Allergies   Current Outpatient Medications  Medication Instructions   albuterol (VENTOLIN HFA) 108 (90 Base) MCG/ACT inhaler 1-2 puffs, Inhalation,  Every 6 hours PRN   amLODipine-valsartan (EXFORGE) 10-160 MG tablet 1 tablet, Oral,  Daily   aspirin EC 81 mg, Oral, Daily   CINNAMON PO 1 tablet, Oral, Daily   citalopram (CELEXA) 20 mg, Oral, Daily   levothyroxine (SYNTHROID) 50 mcg, Oral, Daily before breakfast   Omega-3 Fatty Acids (FISH OIL PO) 1 capsule, Oral, Daily   Radiology:  DG Chest 2 View  Result Date: 01/24/2022 CLINICAL DATA:  Shortness of breath.  Chest tightness. EXAM: CHEST - 2 VIEW COMPARISON:  12/03/2021 FINDINGS: Artifact overlies the chest. Heart size is normal. Mediastinal shadows are normal. The lungs are clear. No effusions. No significant bone finding. IMPRESSION: No active cardiopulmonary disease. Electronically Signed   By: Kirk Chimes M.D.   On: 01/24/2022 11:51    Cardiac Studies:   EKG 01/24/2022: Normal sinus rhythm, repolarization.  No change from 12/03/2021.  Normal EKG.  Assessment   1.  Precordial pain, very low suspicion for unstable angina in view of ongoing chest pain since 8 AM and still cardiac markers are negative, EKG is normal.  She still has persistent chest pain but has reproducible quality to this. 2.  Primary hypertension  Recommendations:   Patient with no significant cardiovascular disease except for hypertension admitted to the hospital with chest pain that appears at most atypical for angina.  My suspicion for unstable angina is very low in view of normal EKG, reproducible chest pain and troponins been negative in spite of chest pain ongoing for several hours.  She also has mildly reproducible chest pain and also nitrate responsive chest pain could have been related to her underlying GERD.  She in fact request GI consultation.  I offered her discharge from the hospital and perform outpatient stress test for restratification.  Patient prefers to be in the hospital as she is worried about cardiac status, exercise nuclear stress test will be set up and if negative will be discharged home.  Lipids will be checked.  For now I will continue aspirin, but do not think she needs IV  heparin.  Her son Vicente Males is present and all questions answered.   Adrian Prows, MD, [facility] 01/24/2022, 5:52 PM Office: [phone] Fax: [phone] Pager: [phone]

## 2022-01-24 NOTE — ED Provider Triage Note (Signed)
Emergency Medicine Provider Triage Evaluation Note  Sandra Norman , a 67 y.o. female  was evaluated in triage.  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 Denies any headache, endorses nausea. Reports similar symptoms in December.  Review of Systems  Positive: Chest pain, SOB Negative: headache  Physical Exam  Ht '5\' 3"'$  (1.6 m)   Wt 72.6 kg   BMI 28.34 kg/m  Gen:   Awake, no distress   Resp:  Normal effort  MSK:   Moves extremities without difficulty  Other:  Equal pulses bilaterally  Medical Decision Making  Medically screening exam initiated at 10:35 AM.  Appropriate orders placed.  Loreli Debruler was informed that the remainder of the evaluation will be completed by another provider, this initial triage assessment does not replace that evaluation, and the importance of remaining in the ED until their evaluation is complete.     Osvaldo Shipper, [place] 01/24/22 1037

## 2022-01-24 NOTE — Progress Notes (Addendum)
ANTICOAGULATION CONSULT NOTE - Initial Consult  Pharmacy Consult for heparin Indication: chest pain/ACS  No Known Allergies  Patient Measurements: Height: '5\' 3"'$  (160 cm) Weight: 72.6 kg (160 lb) IBW/kg (Calculated) : 52.4 HEPARIN DW (KG): 67.6   Vital Signs: Temp: 97.9 F (36.6 C) (01/22 1517) Temp Source: Oral (01/22 1517) BP: 142/76 (01/22 1615) Pulse Rate: 55 (01/22 1615)  Labs: Recent Labs    01/24/22 1050 01/24/22 1236  HGB 13.5  --   HCT 41.2  --   PLT 285  --   CREATININE 1.06*  --   TROPONINIHS 6 5    Estimated Creatinine Clearance: 49.2 mL/min (A) (by C-G formula based on SCr of 1.06 mg/dL (H)).   Medical History: Past Medical History:  Diagnosis Date   Hypertension      Assessment: Patient presenting to ED with chest pain and unstable angina. No AC PTA. Pharmacy consulted to dose heparin.   Hgb 13.5 and plt 285. Scr 1.06.   Goal of Therapy:  Heparin level 0.3-0.7 units/ml Monitor platelets by anticoagulation protocol: Yes   Plan:  Give heparin 4000 units IV x1  Start heparin infusion at 800 units/hr  Will f/u heparin level in 6 hours Monitor daily heparin level and CBC Continue to monitor H&H     PM addendum: heparin per pharmacy discontinued.     Gena Fray, PharmD PGY1 Pharmacy Resident   01/24/2022 5:47 PM

## 2022-01-25 ENCOUNTER — Ambulatory Visit: Payer: BC Managed Care – PPO | Admitting: Internal Medicine

## 2022-01-25 ENCOUNTER — Observation Stay (HOSPITAL_COMMUNITY): Payer: BC Managed Care – PPO

## 2022-01-25 ENCOUNTER — Telehealth: Payer: Self-pay | Admitting: Internal Medicine

## 2022-01-25 DIAGNOSIS — R0789 Other chest pain: Secondary | ICD-10-CM | POA: Diagnosis not present

## 2022-01-25 LAB — CBC
HCT: 37.3 % (ref 36.0–46.0)
Hemoglobin: 12.3 g/dL (ref 12.0–15.0)
MCH: 28 pg (ref 26.0–34.0)
MCHC: 33 g/dL (ref 30.0–36.0)
MCV: 84.8 fL (ref 80.0–100.0)
Platelets: 267 10*3/uL (ref 150–400)
RBC: 4.4 MIL/uL (ref 3.87–5.11)
RDW: 14.3 % (ref 11.5–15.5)
WBC: 7.5 10*3/uL (ref 4.0–10.5)
nRBC: 0 % (ref 0.0–0.2)

## 2022-01-25 MED ORDER — OMEPRAZOLE 40 MG PO CPDR: 40.0000 mg | DELAYED_RELEASE_CAPSULE | Freq: Every day | ORAL | 0 refills | Status: AC

## 2022-01-25 MED ORDER — TECHNETIUM TC 99M TETROFOSMIN IV KIT
10.7000 | PACK | Freq: Once | INTRAVENOUS | Status: AC | PRN
  Administered 2022-01-25: 10.7 via INTRAVENOUS

## 2022-01-25 MED ORDER — REGADENOSON 0.4 MG/5ML IV SOLN
0.4000 mg | Freq: Once | INTRAVENOUS | Status: AC
  Filled 2022-01-25: qty 5

## 2022-01-25 MED ORDER — REGADENOSON 0.4 MG/5ML IV SOLN
INTRAVENOUS | Status: AC
  Administered 2022-01-25: 0.4 mg via INTRAVENOUS
  Filled 2022-01-25: qty 5

## 2022-01-25 MED ORDER — TECHNETIUM TC 99M TETROFOSMIN IV KIT
30.9000 | PACK | Freq: Once | INTRAVENOUS | Status: AC | PRN
  Administered 2022-01-25: 30.9 via INTRAVENOUS

## 2022-01-25 NOTE — Discharge Summary (Signed)
Physician Discharge Summary  Patient ID: Sandra Norman MRN: [id number] DOB/AGE: 1955/12/14 67 y.o. [name], MD   Admit date: 01/24/2022 Discharge date: 01/25/2022  Primary Discharge Diagnosis Precordial pain most probably related to GERD and musculoskeletal chest pain. Mild mixed hyperlipidemia Primary hypertension  Significant Diagnostic Studies:  Regadenoson  Nuclear stress test 01/26/2022: 1. No reversible ischemia or infarction. Diaphragmatic attenuation artifact noted. 2. Normal left ventricular wall motion. 3. Left ventricular ejection fraction 69% 4. Non invasive risk stratification*: Low  DG Chest 2 View   Result Date: 01/24/2022 CLINICAL DATA:  Shortness of breath.  Chest tightness. EXAM: CHEST - 2 VIEW COMPARISON:  12/03/2021 FINDINGS: Artifact overlies the chest. Heart size is normal. Mediastinal shadows are normal. The lungs are clear. No effusions. No significant bone finding. IMPRESSION: No active cardiopulmonary disease   EKG 01/24/2022: Normal sinus rhythm, repolarization. No change from 12/03/2021. Normal EKG.  Hospital Course: Sandra Norman is a 67 y.o. female  patient with primary hypertension, mild mixed hyperlipidemia, nondiabetic, no family history of premature coronary disease, seen in the ED a month ago with midsternal chest pain during routine activity.  And presented again on 01/25/2022 with chest pain, went to the bathroom and after bowel movement came back and felt dizzy, diaphoretic and near syncope and EMS was called in.  Because she complained of left arm tingling and numbness, patient was also brought to the hospital for further evaluation.  I felt her examination was more consistent with musculoskeletal chest pain and GERD related issues, however due to patient preference, and also nitrate responsive chest pain, she was felt to be at least intermediate risk for coronary artery disease, hence admitted overnight and underwent nuclear stress the  following morning which was normal and feel stable for discharge.  Recommendations on discharge: Patient needs noncardiac chest pain workup, most probably related to GERD.  Patient has been discharged on omeprazole 40 mg daily.  Aspirin was discontinued.  Discharge Exam:    01/25/2022    2:16 PM 01/25/2022   11:54 AM 01/25/2022   11:52 AM  Vitals with BMI  Systolic 412 878 676  Diastolic 72 79 50  Pulse 59       Physical Exam Neck:     Vascular: No carotid bruit or JVD.  Cardiovascular:     Rate and Rhythm: Normal rate and regular rhythm.     Pulses: Intact distal pulses.     Heart sounds: Normal heart sounds. No murmur heard.    No gallop.  Pulmonary:     Effort: Pulmonary effort is normal.     Breath sounds: Normal breath sounds.  Abdominal:     General: Bowel sounds are normal.     Palpations: Abdomen is soft.  Musculoskeletal:     Right lower leg: No edema.     Left lower leg: No edema.    Labs:   Lab Results  Component Value Date   WBC 7.5 01/25/2022   HGB 12.3 01/25/2022   HCT 37.3 01/25/2022   MCV 84.8 01/25/2022   PLT 267 01/25/2022    Recent Labs  Lab 01/24/22 1050  NA 139  K 3.7  CL 106  CO2 23  BUN 15  CREATININE 1.06*  CALCIUM 9.3  PROT 7.3  BILITOT 0.4  ALKPHOS 64  ALT 22  AST 26  GLUCOSE 103*    Lipid Panel     Component Value Date/Time   CHOL 178 01/24/2022 1932   TRIG 161 (H) 01/24/2022 1932  HDL 40 (L) 01/24/2022 1932   CHOLHDL 4.5 01/24/2022 1932   VLDL 32 01/24/2022 1932   LDLCALC 106 (H) 01/24/2022 1932  Cardiac Panel (last 3 results) Recent Labs    01/24/22 1050 01/24/22 1236 01/24/22 1932  TROPONINIHS '6 5 6    '$ FOLLOW UP PLANS AND APPOINTMENTS  Allergies as of 01/25/2022   No Known Allergies      Medication List     STOP taking these medications    aspirin EC 81 MG tablet       TAKE these medications    albuterol 108 (90 Base) MCG/ACT inhaler Commonly known as: VENTOLIN HFA Inhale 1-2 puffs into the  lungs every 6 (six) hours as needed for wheezing or shortness of breath.   amLODipine-valsartan 10-160 MG tablet Commonly known as: EXFORGE Take 1 tablet by mouth daily.   CINNAMON PO Take 1 tablet by mouth daily.   citalopram 20 MG tablet Commonly known as: CELEXA Take 20 mg by mouth daily.   FISH OIL PO Take 1 capsule by mouth daily.   levothyroxine 50 MCG tablet Commonly known as: SYNTHROID Take 50 mcg by mouth daily before breakfast.   omeprazole 40 MG capsule Commonly known as: PRILOSEC Take 1 capsule (40 mg total) by mouth daily before breakfast.        Follow-up Information     [name], MD. Call.   Specialty: Family Medicine Why: To be seen in 2 weeks Contact information: [address] [place] [address] [phone]                  Adrian Prows, MD, [facility] 01/25/2022, 2:17 PM Office: [phone]

## 2022-01-25 NOTE — Progress Notes (Signed)
Nuclear stress test completed. No complications noted. No s/s of distress at this time.

## 2022-01-25 NOTE — Discharge Planning (Signed)
  Transition of Care [facility]) Screening Note   Patient Details  Name: Ciara Kagan Date of Birth: 1955/08/29   Transition of Care [facility]) CM/SW Contact:    Fuller Mandril, RN Phone Number: 01/25/2022, 2:25 PM    Transition of Care Department [facility]) has reviewed patient and no TOC needs have been identified at this time. We will continue to monitor patient advancement through interdisciplinary progression rounds. If new patient transition needs arise, please place a TOC consult.

## 2022-01-25 NOTE — ED Notes (Signed)
Patient alerts and oriented x4, ambulates to restroom, steady gait noted.

## 2022-01-25 NOTE — ED Notes (Signed)
DC instructions reviewed with pt. Pt verbalized understanding.  Pt DC 

## 2022-01-25 NOTE — Telephone Encounter (Signed)
Please disregard no show, as patient is currently hospitalized.

## 2022-01-25 NOTE — Telephone Encounter (Signed)
ok 

## 2022-01-25 NOTE — ED Notes (Signed)
Patient transported to nuc med for stress test

## 2022-01-29 NOTE — Progress Notes (Signed)
No show

## 2022-02-17 ENCOUNTER — Other Ambulatory Visit: Payer: Self-pay | Admitting: Cardiology

## 2022-06-12 ENCOUNTER — Encounter (HOSPITAL_BASED_OUTPATIENT_CLINIC_OR_DEPARTMENT_OTHER): Payer: Self-pay

## 2022-06-12 ENCOUNTER — Emergency Department (HOSPITAL_BASED_OUTPATIENT_CLINIC_OR_DEPARTMENT_OTHER)
Admission: EM | Admit: 2022-06-12 | Discharge: 2022-06-12 | Disposition: A | Discharge status: Home / Self Care | Payer: BC Managed Care – PPO | Attending: Emergency Medicine | Admitting: Emergency Medicine

## 2022-06-12 ENCOUNTER — Other Ambulatory Visit: Payer: Self-pay

## 2022-06-12 ENCOUNTER — Emergency Department (HOSPITAL_BASED_OUTPATIENT_CLINIC_OR_DEPARTMENT_OTHER): Payer: BC Managed Care – PPO | Admitting: Radiology

## 2022-06-12 DIAGNOSIS — D72829 Elevated white blood cell count, unspecified: Secondary | ICD-10-CM | POA: Insufficient documentation

## 2022-06-12 DIAGNOSIS — J029 Acute pharyngitis, unspecified: Secondary | ICD-10-CM | POA: Insufficient documentation

## 2022-06-12 DIAGNOSIS — Z1152 Encounter for screening for COVID-19: Secondary | ICD-10-CM | POA: Insufficient documentation

## 2022-06-12 DIAGNOSIS — Z79899 Other long term (current) drug therapy: Secondary | ICD-10-CM | POA: Insufficient documentation

## 2022-06-12 DIAGNOSIS — I10 Essential (primary) hypertension: Secondary | ICD-10-CM | POA: Insufficient documentation

## 2022-06-12 DIAGNOSIS — J209 Acute bronchitis, unspecified: Secondary | ICD-10-CM | POA: Insufficient documentation

## 2022-06-12 LAB — CBC WITH DIFFERENTIAL/PLATELET
Abs Immature Granulocytes: 0.08 10*3/uL — ABNORMAL HIGH (ref 0.00–0.07)
Basophils Absolute: 0.1 10*3/uL (ref 0.0–0.1)
Basophils Relative: 1 %
Eosinophils Absolute: 0.5 10*3/uL (ref 0.0–0.5)
Eosinophils Relative: 4 %
HCT: 37.3 % (ref 36.0–46.0)
Hemoglobin: 12.1 g/dL (ref 12.0–15.0)
Immature Granulocytes: 1 %
Lymphocytes Relative: 12 %
Lymphs Abs: 1.5 10*3/uL (ref 0.7–4.0)
MCH: 26.9 pg (ref 26.0–34.0)
MCHC: 32.4 g/dL (ref 30.0–36.0)
MCV: 82.9 fL (ref 80.0–100.0)
Monocytes Absolute: 1.3 10*3/uL — ABNORMAL HIGH (ref 0.1–1.0)
Monocytes Relative: 11 %
Neutro Abs: 8.9 10*3/uL — ABNORMAL HIGH (ref 1.7–7.7)
Neutrophils Relative %: 71 %
Platelets: 276 10*3/uL (ref 150–400)
RBC: 4.5 MIL/uL (ref 3.87–5.11)
RDW: 14.3 % (ref 11.5–15.5)
WBC: 12.4 10*3/uL — ABNORMAL HIGH (ref 4.0–10.5)
nRBC: 0 % (ref 0.0–0.2)

## 2022-06-12 LAB — BASIC METABOLIC PANEL
Anion gap: 9 (ref 5–15)
BUN: 11 mg/dL (ref 8–23)
CO2: 25 mmol/L (ref 22–32)
Calcium: 9.1 mg/dL (ref 8.9–10.3)
Chloride: 105 mmol/L (ref 98–111)
Creatinine, Ser: 0.96 mg/dL (ref 0.44–1.00)
GFR, Estimated: >60 mL/min (ref >60)
Glucose, Bld: 104 mg/dL — ABNORMAL HIGH (ref 70–99)
Potassium: 3.5 mmol/L (ref 3.5–5.1)
Sodium: 139 mmol/L (ref 135–145)

## 2022-06-12 LAB — GROUP A STREP BY PCR: Group A Strep by PCR: NOT DETECTED

## 2022-06-12 LAB — SARS CORONAVIRUS 2 BY RT PCR: SARS Coronavirus 2 by RT PCR: NEGATIVE

## 2022-06-12 MED ORDER — SODIUM CHLORIDE 0.9 % IV BOLUS
500.0000 mL | Freq: Once | INTRAVENOUS | Status: AC
  Administered 2022-06-12: 500 mL via INTRAVENOUS

## 2022-06-12 MED ORDER — ONDANSETRON HCL 4 MG/2ML IJ SOLN
4.0000 mg | Freq: Once | INTRAMUSCULAR | Status: AC
  Administered 2022-06-12: 4 mg via INTRAVENOUS
  Filled 2022-06-12: qty 2

## 2022-06-12 MED ORDER — AZITHROMYCIN 250 MG PO TABS: 250.0000 mg | ORAL_TABLET | Freq: Every day | ORAL | 0 refills | Status: AC

## 2022-06-12 NOTE — Discharge Instructions (Signed)
You were seen in the emergency department for sore throat cough fatigue.  Your COVID and strep test were negative and your chest x-ray did not show any signs of pneumonia.  We are putting you on an antibiotic for possible bronchitis.  Please drink plenty fluids rest and follow-up with your primary care doctor.  Return to the emergency department if any worsening or concerning symptoms.

## 2022-06-12 NOTE — ED Triage Notes (Signed)
Pt POV from home c/o sore throat, weakness, body aches and a headache since 4am this morning. Pt denies sick contacts. Pt also c/o new onset cough starting yesterday.

## 2022-06-12 NOTE — ED Provider Notes (Signed)
[place] EMERGENCY DEPARTMENT AT [facility] Provider Note   CSN: [id number] Arrival date & time: 06/12/22  1126     History  Chief Complaint  Patient presents with   Sore Throat    Sandra Norman is a 67 y.o. female.  She has a history of hypertension.  Complaining of sore throat that started yesterday which evolved into nonproductive cough, body aches, headache, 1 episode of vomiting with nausea.  No diarrhea no chest pain no shortness of breath.  No urinary symptoms.  No sick contacts or recent travel.  Has tried nothing for symptoms.  The history is provided by the patient.  URI Presenting symptoms: cough, fatigue and sore throat   Severity:  Moderate Onset quality:  Gradual Duration:  2 days Timing:  Constant Progression:  Unchanged Chronicity:  New Relieved by:  None tried Worsened by:  Nothing Ineffective treatments:  None tried Associated symptoms: headaches and myalgias   Risk factors: no diabetes mellitus, no recent travel and no sick contacts        Home Medications Prior to Admission medications   Medication Sig Start Date End Date Taking? Authorizing Provider  albuterol (VENTOLIN HFA) 108 (90 Base) MCG/ACT inhaler Inhale 1-2 puffs into the lungs every 6 (six) hours as needed for wheezing or shortness of breath.    [provider]  amLODipine-valsartan (EXFORGE) 10-160 MG tablet Take 1 tablet by mouth daily. 09/27/21   [provider]  CINNAMON PO Take 1 tablet by mouth daily.    [provider]  citalopram (CELEXA) 20 MG tablet Take 20 mg by mouth daily. 10/09/21   [provider]  levothyroxine (SYNTHROID, LEVOTHROID) 50 MCG tablet Take 50 mcg by mouth daily before breakfast.    [provider]  Omega-3 Fatty Acids (FISH OIL PO) Take 1 capsule by mouth daily.    [provider]  omeprazole (PRILOSEC) 40 MG capsule Take 1 capsule (40 mg total) by mouth daily before breakfast. 01/25/22   Yates Decamp, MD       Allergies    Patient has no known allergies.    Review of Systems   Review of Systems  Constitutional:  Positive for fatigue.  HENT:  Positive for sore throat.   Respiratory:  Positive for cough.   Cardiovascular:  Negative for chest pain.  Gastrointestinal:  Positive for nausea and vomiting. Negative for diarrhea.  Genitourinary:  Negative for dysuria.  Musculoskeletal:  Positive for myalgias.  Neurological:  Positive for headaches.    Physical Exam Updated Vital Signs BP 131/79 (BP Location: Right Arm)   Pulse 74   Temp 97.8 F (36.6 C) (Oral)   Resp 20   Ht 5\' 3"  (1.6 m)   Wt 72.6 kg   SpO2 96%   BMI 28.34 kg/m  Physical Exam Vitals and nursing note reviewed.  Constitutional:      General: She is not in acute distress.    Appearance: Normal appearance. She is well-developed.  HENT:     Head: Normocephalic and atraumatic.     Mouth/Throat:     Mouth: Mucous membranes are moist.     Pharynx: Oropharynx is clear. No oropharyngeal exudate or posterior oropharyngeal erythema.  Eyes:     Conjunctiva/sclera: Conjunctivae normal.  Cardiovascular:     Rate and Rhythm: Normal rate and regular rhythm.     Heart sounds: No murmur heard. Pulmonary:     Effort: Pulmonary effort is normal. No respiratory distress.     Breath sounds:  Normal breath sounds.  Abdominal:     Palpations: Abdomen is soft.     Tenderness: There is no abdominal tenderness. There is no guarding or rebound.  Musculoskeletal:        General: No swelling.     Cervical back: Neck supple.  Skin:    General: Skin is warm and dry.     Capillary Refill: Capillary refill takes less than 2 seconds.  Neurological:     General: No focal deficit present.     Mental Status: She is alert.     ED Results / Procedures / Treatments   Labs (all labs ordered are listed, but only abnormal results are displayed) Labs Reviewed  BASIC METABOLIC PANEL - Abnormal; Notable for the following components:       Result Value   Glucose, Bld 104 (*)    All other components within normal limits  CBC WITH DIFFERENTIAL/PLATELET - Abnormal; Notable for the following components:   WBC 12.4 (*)    Neutro Abs 8.9 (*)    Monocytes Absolute 1.3 (*)    Abs Immature Granulocytes 0.08 (*)    All other components within normal limits  GROUP A STREP BY PCR  SARS CORONAVIRUS 2 BY RT PCR    EKG None  Radiology DG Chest 2 View  Result Date: 06/12/2022 CLINICAL DATA:  Cough. EXAM: CHEST - 2 VIEW COMPARISON:  01/24/2022 FINDINGS: Stable chronic atelectasis or scarring at the left base. Right lung clear. The cardiopericardial silhouette is within normal limits for size. No acute bony abnormality. IMPRESSION: Chronic atelectasis or scarring at the left base. No acute cardiopulmonary findings. Electronically Signed   By: Kennith Center M.D.   On: 06/12/2022 12:41    Procedures Procedures    Medications Ordered in ED Medications  sodium chloride 0.9 % bolus 500 mL (has no administration in time range)    ED Course/ Medical Decision Making/ A&P Clinical Course as of 06/12/22 1650  Sun Jun 12, 2022  1219 Chest x-ray interpreted by me as no acute infiltrate.  Awaiting radiology reading. [MB]  1323 Reviewed results of workup with patient.  Will cover with antibiotics for possible bronchitis.  Recommended close follow-up with PCP. [MB]    Clinical Course User Index [MB] Terrilee Files, MD                             Medical Decision Making Amount and/or Complexity of Data Reviewed Labs: ordered. Radiology: ordered.  Risk Prescription drug management.   This patient complains of sore throat cough body aches malaise; this involves an extensive number of treatment Options and is a complaint that carries with it a high risk of complications and morbidity. The differential includes viral syndrome, COVID, flu, pneumonia, strep throat  I ordered, reviewed and interpreted labs, which included strep  negative, CBC with elevated white count, chemistries unremarkable, COVID-negative I ordered medication IV fluids nausea medication and reviewed PMP when indicated. I ordered imaging studies which included chest x-ray and I independently    visualized and interpreted imaging which showed no acute infiltrate Additional history obtained from patient's significant other Previous records obtained and reviewed in epic no recent admissions  Cardiac monitoring reviewed, normal sinus rhythm Social determinants considered, no significant barriers Critical Interventions: None  After the interventions stated above, I reevaluated the patient and found patient to be symptomatically improved in no distress Admission and further testing considered, no indications for admission or  further workup at this time.  Will cover with antibiotics for possible bronchitis and recommended close follow-up with PCP.  Return instructions discussed         Final Clinical Impression(s) / ED Diagnoses Final diagnoses:  Acute pharyngitis, unspecified etiology  Acute bronchitis, unspecified organism    Rx / DC Orders ED Discharge Orders          Ordered    azithromycin (ZITHROMAX) 250 MG tablet  Daily        06/12/22 1324              Terrilee Files, MD 06/12/22 1651

## 2022-06-24 ENCOUNTER — Other Ambulatory Visit: Payer: Self-pay | Admitting: Family Medicine

## 2022-06-24 DIAGNOSIS — Z780 Asymptomatic menopausal state: Secondary | ICD-10-CM

## 2022-07-17 IMAGING — CT CT HEAD W/O CM
4 series · 16 of 47 positions shown, 18 images · non-contrast
Comparison: CT 08/07/2003

CLINICAL DATA: Head trauma, minor (Age >= 65y); Neck trauma (Age >=
65y)



[Series 2: head wo · axial · 0.43mm/px · z∈[-153,-43]mm · 7 of 30 slices shown, 9 images]
[im 4/30  brain]
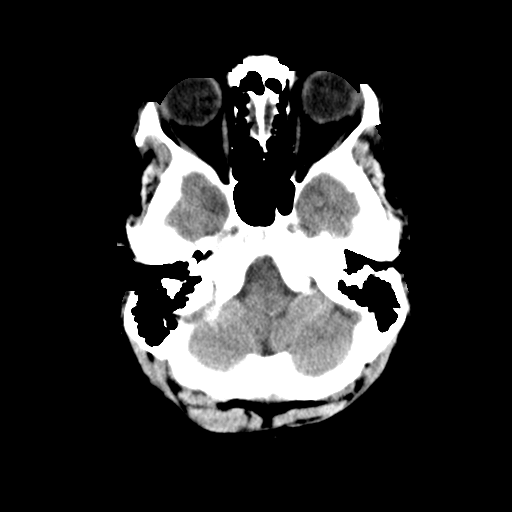
[im 4/30  bone]
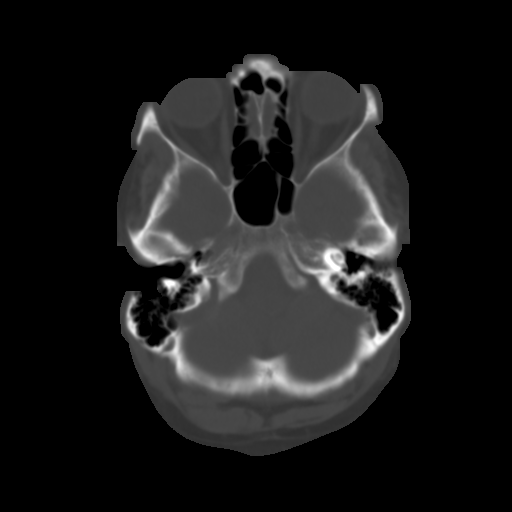
[im 8/30  brain]
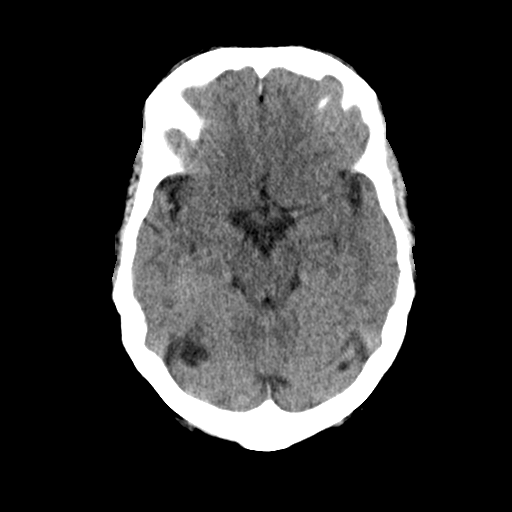
[im 11/30  brain]
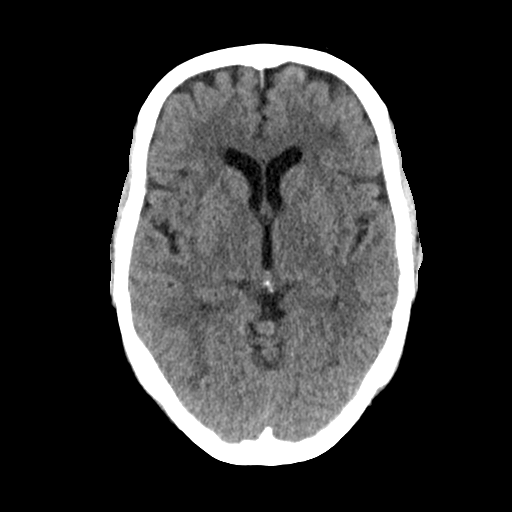
[im 15/30  brain]
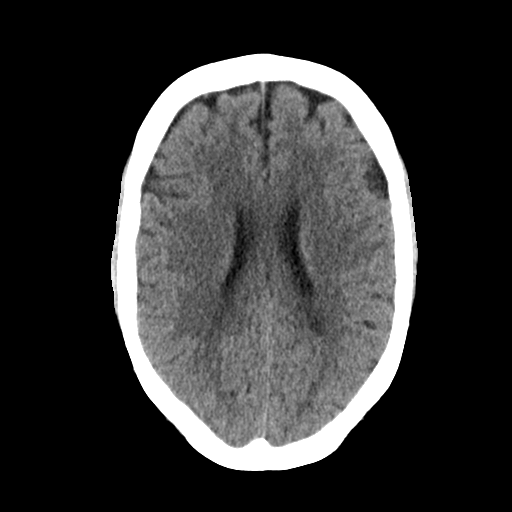
[im 19/30  brain]
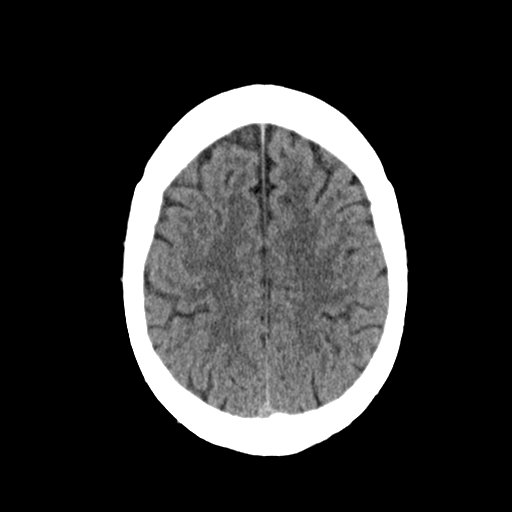
[im 19/30  bone]
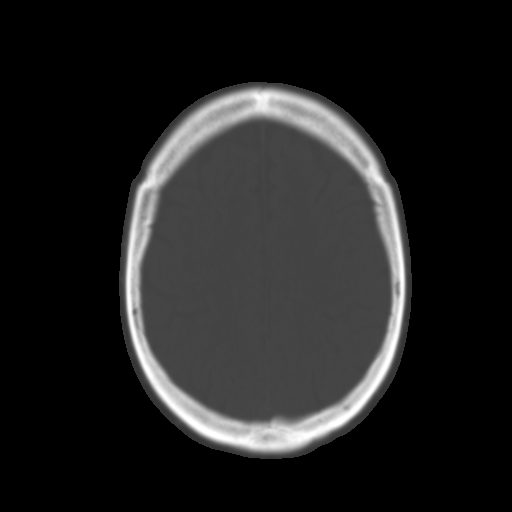
[im 22/30  brain]
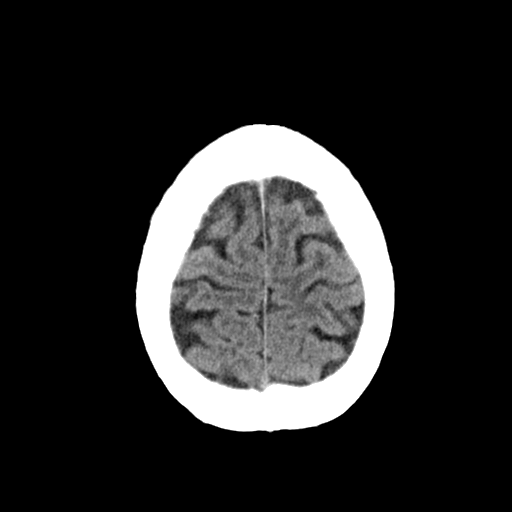
[im 26/30  brain]
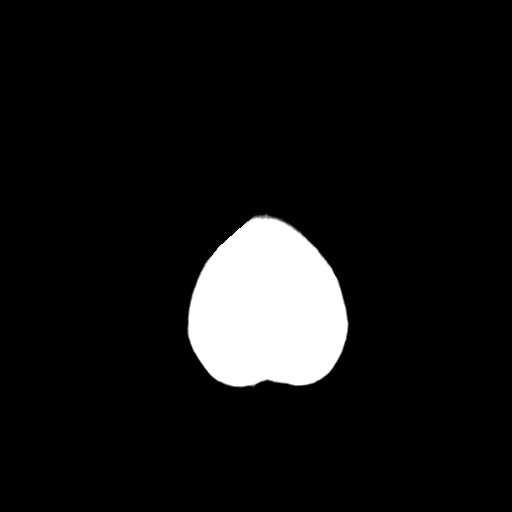

[Series 3: head bone · axial · 0.43mm/px · z∈[-154,-124]mm · 3 of 75 slices shown]
[im 8/75  bone]
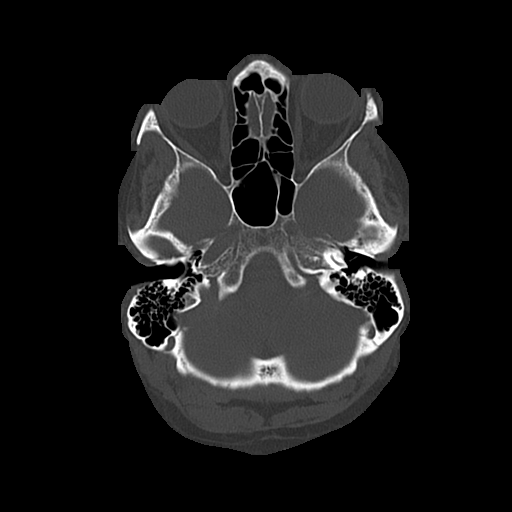
[im 15/75  bone]
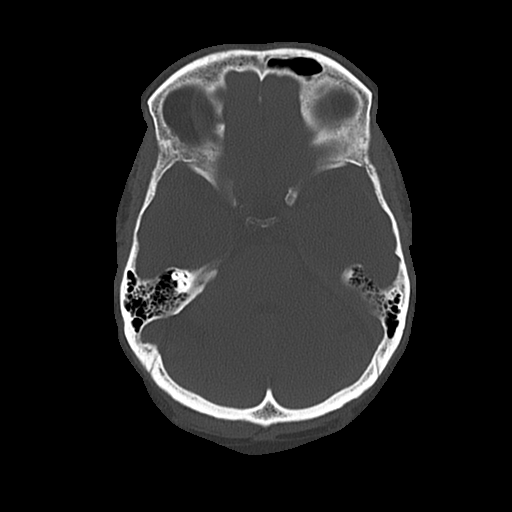
[im 23/75  bone]
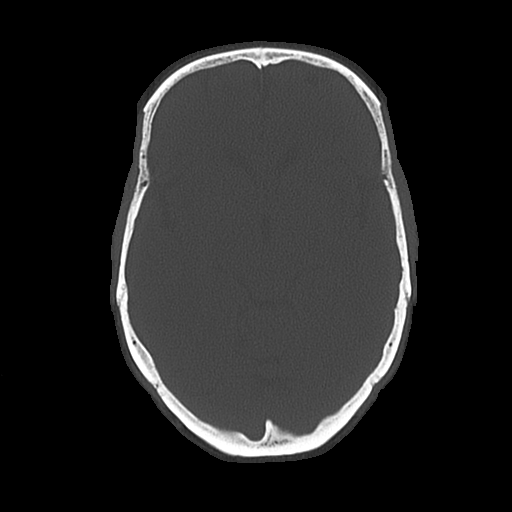

[Series 4: coronal soft · coronal · 0.31mm/px · 3 of 66 slices shown]
[im 22/66  brain]
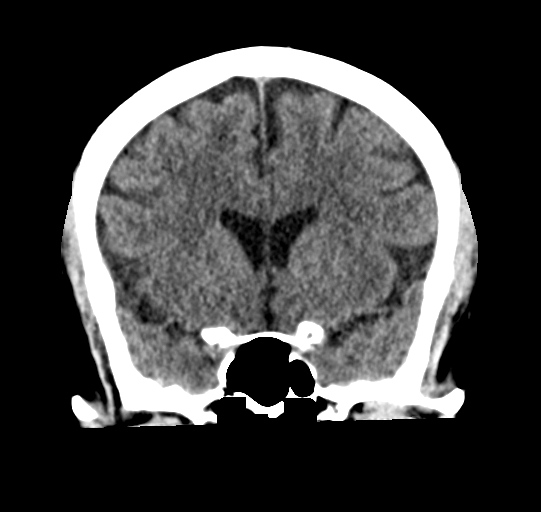
[im 29/66  brain]
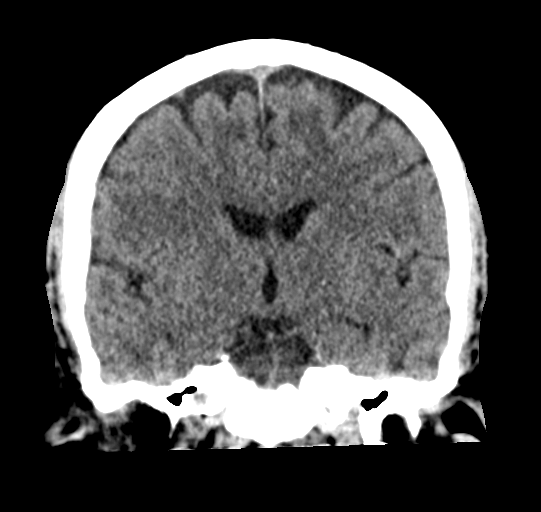
[im 37/66  brain]
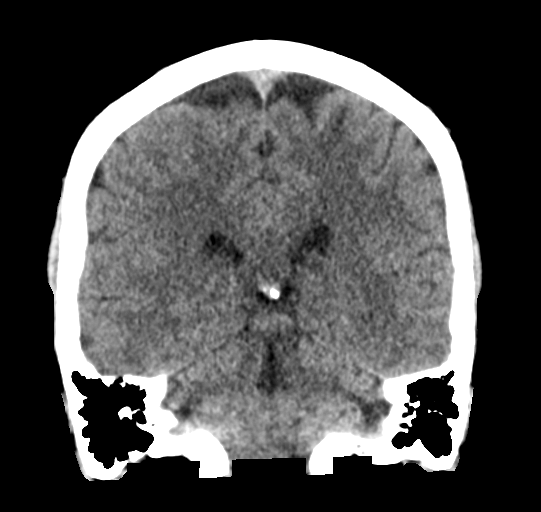

[Series 5: sagittal soft · sagittal · 0.31mm/px · 3 of 51 slices shown]
[im 17/51  brain]
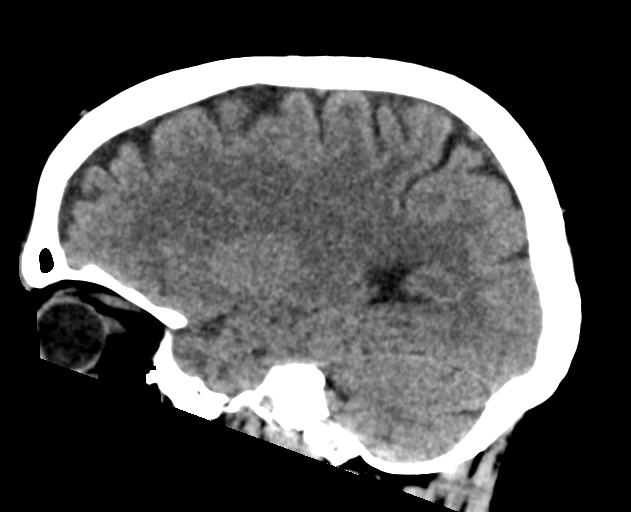
[im 26/51  brain]
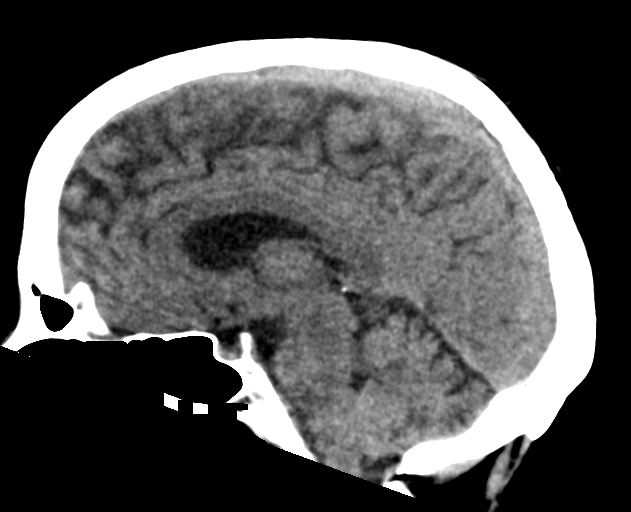
[im 34/51  brain]
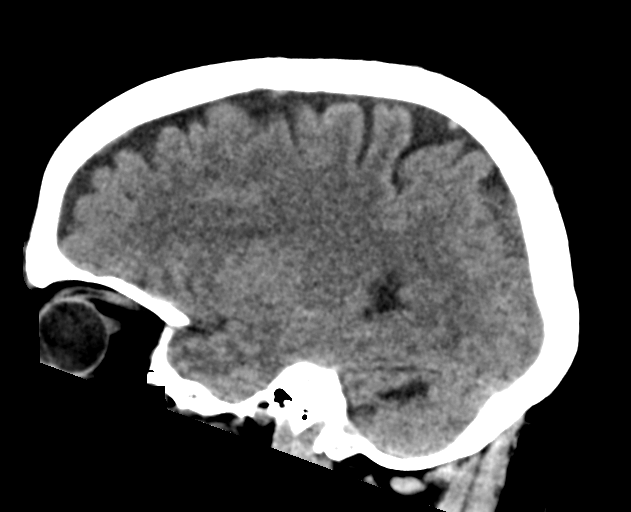

[16 of 47 positions shown; findings below may reference images not displayed]

FINDINGS: CT HEAD FINDINGS

Brain: No evidence of acute infarction, hemorrhage, hydrocephalus,
extra-axial collection or mass lesion/mass effect. Scattered
low-density changes within the periventricular and subcortical white
matter compatible with chronic microvascular ischemic change.

Vascular: No hyperdense vessel or unexpected calcification.

Skull: Normal. Negative for fracture or focal lesion.

Sinuses/Orbits: No acute finding.

Other: Negative for scalp hematoma.

CT CERVICAL SPINE FINDINGS

Alignment: Facet joints are aligned without dislocation or traumatic
listhesis. Dens and lateral masses are aligned.

Skull base and vertebrae: No acute fracture. No primary bone lesion
or focal pathologic process.

Soft tissues and spinal canal: No prevertebral fluid or swelling. No
visible canal hematoma.

Disc levels: Relatively mild cervical spondylosis, most
predominantly involving the facet joints.

Upper chest: Negative.

Other: None.
IMPRESSION: 1. No acute intracranial abnormality.
2. No acute cervical spine fracture or subluxation.

## 2022-07-17 IMAGING — CT CT CERVICAL SPINE W/O CM
3 of 4 series · 13 of 33 positions shown, 16 images · non-contrast
Comparison: CT 08/07/2003

CLINICAL DATA: Head trauma, minor (Age >= 65y); Neck trauma (Age >=
65y)



[Series 5: cor bone · coronal · 0.39mm/px · 3 of 69 slices shown]
[im 14/69  bone]
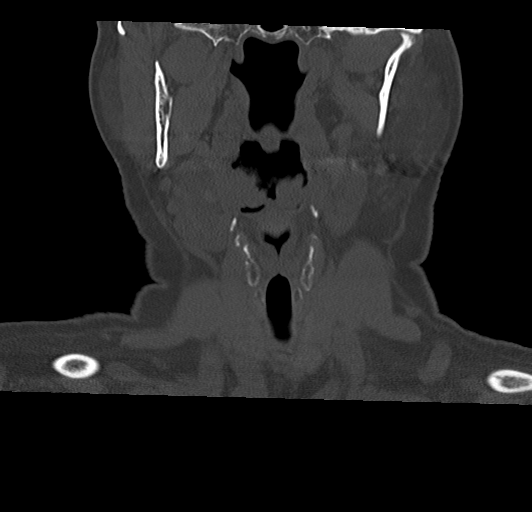
[im 28/69  bone]
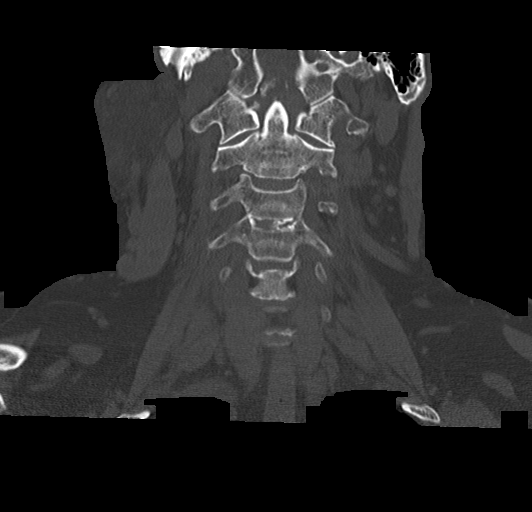
[im 41/69  bone]
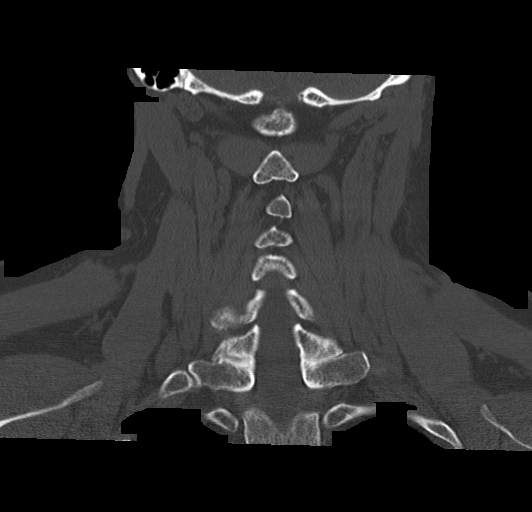

[Series 6: sag bone · sagittal · 0.27mm/px · 5 of 65 slices shown, 6 images]
[im 22/65  bone]
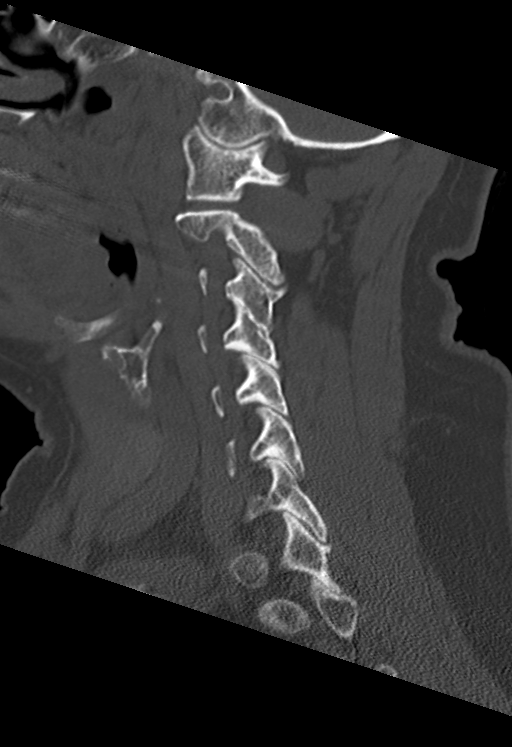
[im 27/65  bone]
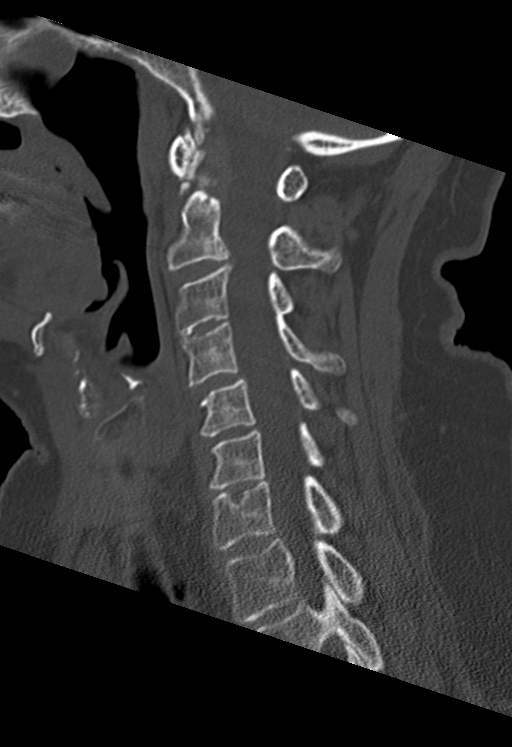
[im 33/65  soft-tissue]
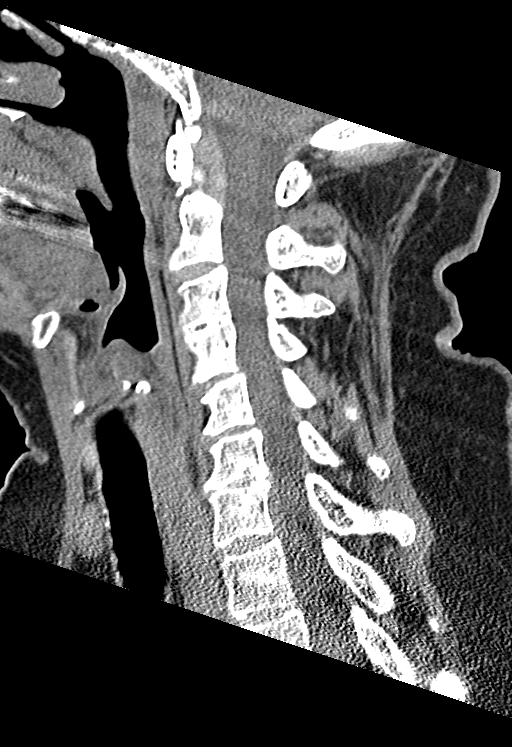
[im 33/65  bone]
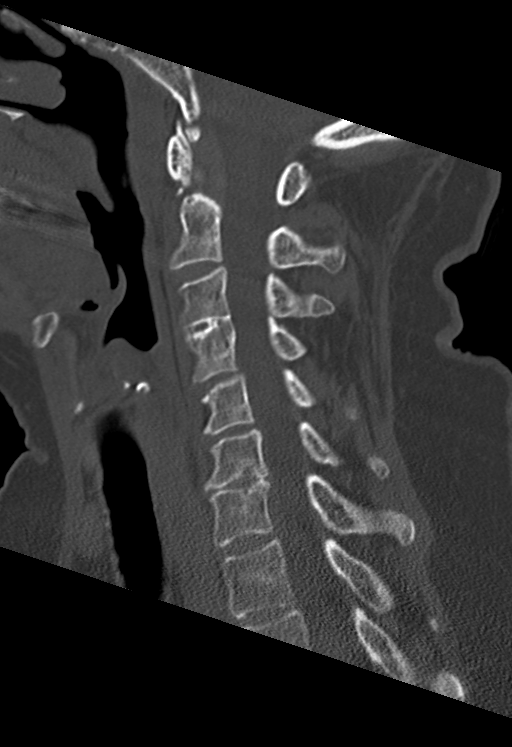
[im 38/65  bone]
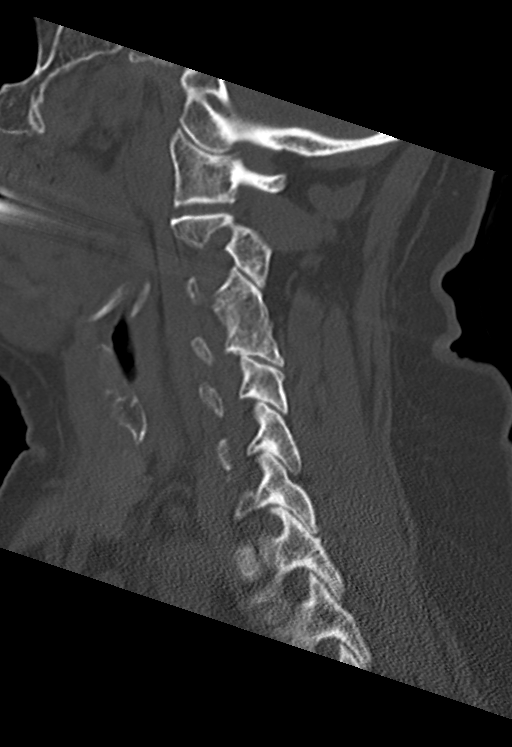
[im 43/65  bone]
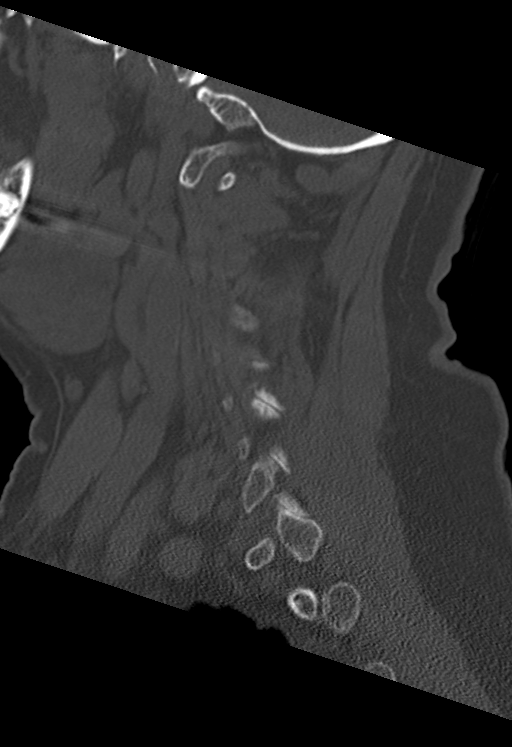

[Series 7: orthogonal axials · axial · 0.21mm/px · z∈[-300,-212]mm · 5 of 75 slices shown, 7 images]
[im 13/75  soft-tissue]
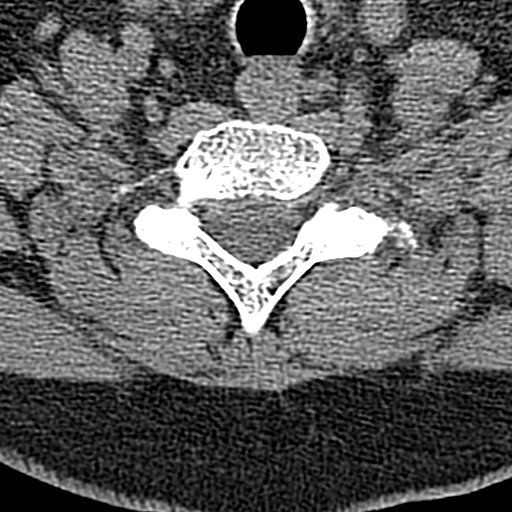
[im 13/75  bone]
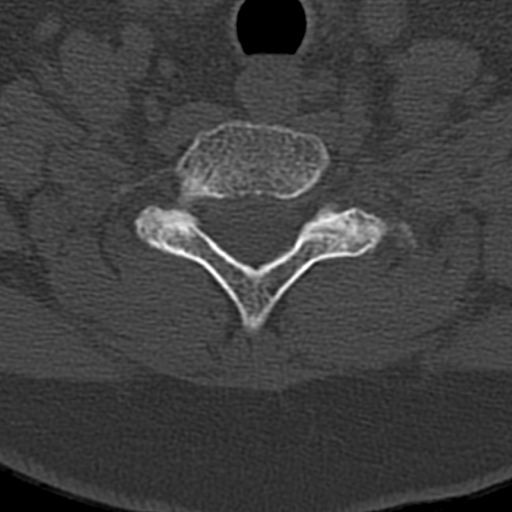
[im 25/75  bone]
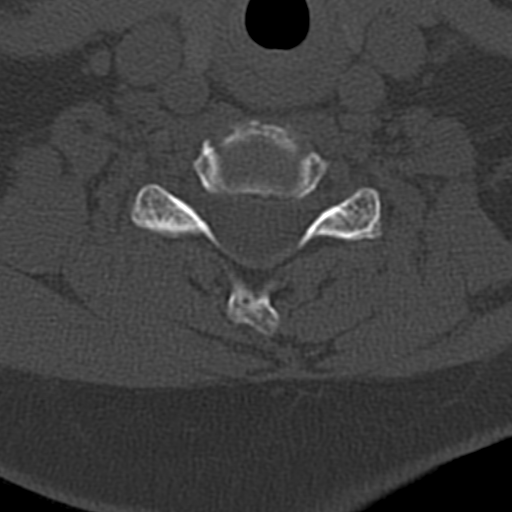
[im 38/75  bone]
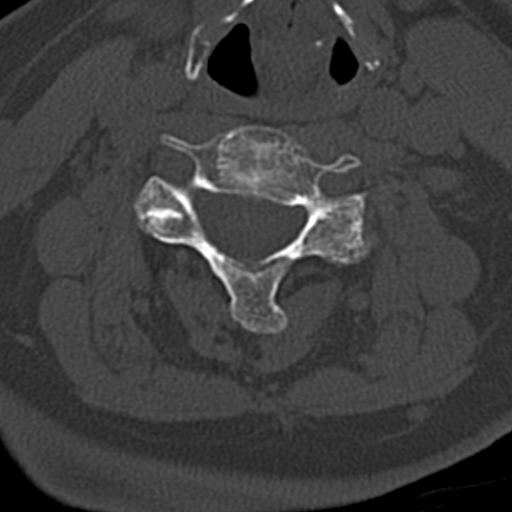
[im 50/75  bone]
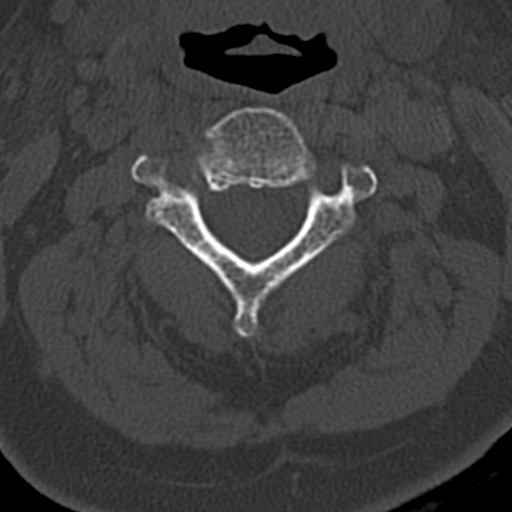
[im 62/75  soft-tissue]
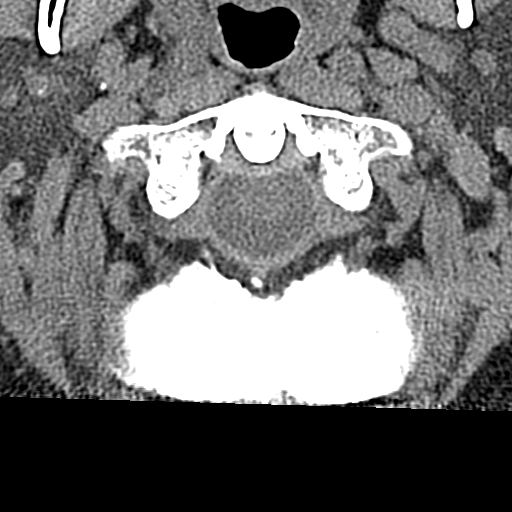
[im 62/75  bone]
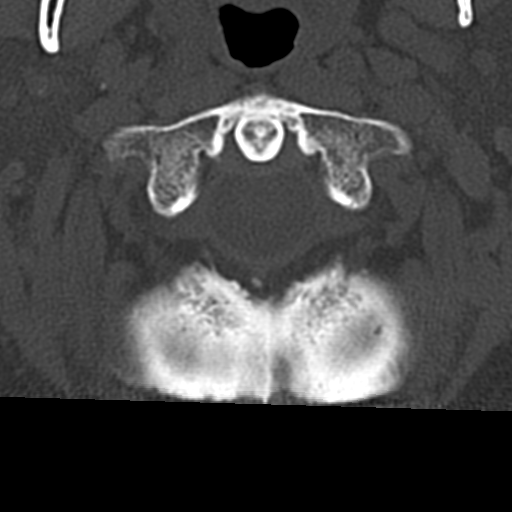

[13 of 33 positions shown; findings below may reference images not displayed]

FINDINGS: CT HEAD FINDINGS

Brain: No evidence of acute infarction, hemorrhage, hydrocephalus,
extra-axial collection or mass lesion/mass effect. Scattered
low-density changes within the periventricular and subcortical white
matter compatible with chronic microvascular ischemic change.

Vascular: No hyperdense vessel or unexpected calcification.

Skull: Normal. Negative for fracture or focal lesion.

Sinuses/Orbits: No acute finding.

Other: Negative for scalp hematoma.

CT CERVICAL SPINE FINDINGS

Alignment: Facet joints are aligned without dislocation or traumatic
listhesis. Dens and lateral masses are aligned.

Skull base and vertebrae: No acute fracture. No primary bone lesion
or focal pathologic process.

Soft tissues and spinal canal: No prevertebral fluid or swelling. No
visible canal hematoma.

Disc levels: Relatively mild cervical spondylosis, most
predominantly involving the facet joints.

Upper chest: Negative.

Other: None.
IMPRESSION: 1. No acute intracranial abnormality.
2. No acute cervical spine fracture or subluxation.

## 2022-08-02 ENCOUNTER — Other Ambulatory Visit (HOSPITAL_COMMUNITY): Payer: Self-pay | Admitting: Gastroenterology

## 2022-08-02 DIAGNOSIS — R1310 Dysphagia, unspecified: Secondary | ICD-10-CM

## 2022-08-18 ENCOUNTER — Ambulatory Visit (HOSPITAL_COMMUNITY)
Admission: RE | Admit: 2022-08-18 | Discharge: 2022-08-18 | Disposition: A | Discharge status: Home / Self Care | Payer: BC Managed Care – PPO | Source: Ambulatory Visit | Attending: Gastroenterology | Admitting: Gastroenterology

## 2022-08-18 DIAGNOSIS — R1310 Dysphagia, unspecified: Secondary | ICD-10-CM | POA: Insufficient documentation

## 2024-01-24 ENCOUNTER — Ambulatory Visit: Admitting: Podiatry
